# Patient Record
Sex: Female | Born: 1975
Health system: Southern US, Community
[De-identification: ages and names within clinical notes are randomized; demographics above are authoritative.]

## PROBLEM LIST (undated history)

## (undated) DIAGNOSIS — E063 Autoimmune thyroiditis: Secondary | ICD-10-CM

## (undated) DIAGNOSIS — J302 Other seasonal allergic rhinitis: Secondary | ICD-10-CM

## (undated) DIAGNOSIS — F419 Anxiety disorder, unspecified: Secondary | ICD-10-CM

## (undated) DIAGNOSIS — I10 Essential (primary) hypertension: Secondary | ICD-10-CM

## (undated) DIAGNOSIS — K219 Gastro-esophageal reflux disease without esophagitis: Secondary | ICD-10-CM

## (undated) DIAGNOSIS — E079 Disorder of thyroid, unspecified: Secondary | ICD-10-CM

## (undated) DIAGNOSIS — Z975 Presence of (intrauterine) contraceptive device: Secondary | ICD-10-CM

## (undated) DIAGNOSIS — Z8489 Family history of other specified conditions: Secondary | ICD-10-CM

## (undated) HISTORY — DX: Autoimmune thyroiditis: E06.3

## (undated) HISTORY — PX: WISDOM TOOTH EXTRACTION: SHX21

## (undated) HISTORY — DX: Essential (primary) hypertension: I10

## (undated) HISTORY — DX: Other seasonal allergic rhinitis: J30.2

## (undated) HISTORY — DX: Presence of (intrauterine) contraceptive device: Z97.5

## (undated) HISTORY — DX: Anxiety disorder, unspecified: F41.9

---

## 2001-01-27 ENCOUNTER — Emergency Department (HOSPITAL_COMMUNITY): Admission: EM | Admit: 2001-01-27 | Discharge: 2001-01-28 | Payer: Self-pay | Admitting: *Deleted

## 2001-01-27 ENCOUNTER — Encounter: Payer: Self-pay | Admitting: *Deleted

## 2001-07-18 ENCOUNTER — Emergency Department (HOSPITAL_COMMUNITY): Admission: EM | Admit: 2001-07-18 | Discharge: 2001-07-18 | Payer: Self-pay | Admitting: Internal Medicine

## 2001-07-18 ENCOUNTER — Encounter: Payer: Self-pay | Admitting: Internal Medicine

## 2006-05-12 ENCOUNTER — Ambulatory Visit: Payer: Self-pay | Admitting: Internal Medicine

## 2006-05-21 ENCOUNTER — Ambulatory Visit (HOSPITAL_COMMUNITY): Admission: RE | Admit: 2006-05-21 | Discharge: 2006-05-21 | Payer: Self-pay | Admitting: Internal Medicine

## 2006-11-04 ENCOUNTER — Ambulatory Visit: Payer: Self-pay | Admitting: Internal Medicine

## 2007-07-27 ENCOUNTER — Ambulatory Visit (HOSPITAL_COMMUNITY): Admission: RE | Admit: 2007-07-27 | Discharge: 2007-07-27 | Payer: Self-pay | Admitting: Obstetrics & Gynecology

## 2007-09-30 ENCOUNTER — Other Ambulatory Visit: Admission: RE | Admit: 2007-09-30 | Discharge: 2007-09-30 | Payer: Self-pay | Admitting: Obstetrics & Gynecology

## 2008-06-07 ENCOUNTER — Ambulatory Visit: Payer: Self-pay | Admitting: Family Medicine

## 2008-06-07 ENCOUNTER — Inpatient Hospital Stay (HOSPITAL_COMMUNITY): Admission: AD | Admit: 2008-06-07 | Discharge: 2008-06-10 | Payer: Self-pay | Admitting: Obstetrics & Gynecology

## 2010-06-25 ENCOUNTER — Other Ambulatory Visit: Admission: RE | Admit: 2010-06-25 | Discharge: 2010-06-25 | Payer: Self-pay | Admitting: Obstetrics and Gynecology

## 2010-12-29 ENCOUNTER — Ambulatory Visit (INDEPENDENT_AMBULATORY_CARE_PROVIDER_SITE_OTHER): Payer: 59 | Admitting: Internal Medicine

## 2010-12-29 DIAGNOSIS — K5900 Constipation, unspecified: Secondary | ICD-10-CM

## 2011-07-30 ENCOUNTER — Other Ambulatory Visit: Payer: Self-pay | Admitting: Obstetrics & Gynecology

## 2011-07-30 ENCOUNTER — Other Ambulatory Visit (HOSPITAL_COMMUNITY)
Admission: RE | Admit: 2011-07-30 | Discharge: 2011-07-30 | Disposition: A | Discharge status: Home / Self Care | Payer: 59 | Source: Ambulatory Visit | Attending: Obstetrics & Gynecology | Admitting: Obstetrics & Gynecology

## 2011-07-30 DIAGNOSIS — Z01419 Encounter for gynecological examination (general) (routine) without abnormal findings: Secondary | ICD-10-CM | POA: Insufficient documentation

## 2011-12-07 ENCOUNTER — Other Ambulatory Visit (INDEPENDENT_AMBULATORY_CARE_PROVIDER_SITE_OTHER): Payer: Self-pay | Admitting: *Deleted

## 2011-12-07 NOTE — Telephone Encounter (Signed)
Patient called [facility] and requested a refill of Amitiza 24 mcg #60. Take one capsule by mouth twice a day.

## 2011-12-08 ENCOUNTER — Other Ambulatory Visit (INDEPENDENT_AMBULATORY_CARE_PROVIDER_SITE_OTHER): Payer: Self-pay | Admitting: *Deleted

## 2011-12-08 MED ORDER — LUBIPROSTONE 24 MCG PO CAPS: 24.0000 ug | ORAL_CAPSULE | Freq: Two times a day (BID) | ORAL | Status: AC

## 2011-12-08 NOTE — Telephone Encounter (Signed)
Patient called [facility] to request a refill on her Amitiza 24 mcg. Take one capsule by mouth twice a day.

## 2011-12-08 NOTE — Telephone Encounter (Signed)
This has been addressed.

## 2012-01-24 ENCOUNTER — Encounter (HOSPITAL_COMMUNITY): Payer: Self-pay | Admitting: *Deleted

## 2012-01-24 ENCOUNTER — Emergency Department (HOSPITAL_COMMUNITY)
Admission: EM | Admit: 2012-01-24 | Discharge: 2012-01-24 | Disposition: A | Discharge status: Home / Self Care | Payer: 59 | Attending: Emergency Medicine | Admitting: Emergency Medicine

## 2012-01-24 DIAGNOSIS — M62838 Other muscle spasm: Secondary | ICD-10-CM | POA: Insufficient documentation

## 2012-01-24 DIAGNOSIS — M25519 Pain in unspecified shoulder: Secondary | ICD-10-CM | POA: Insufficient documentation

## 2012-01-24 DIAGNOSIS — M542 Cervicalgia: Secondary | ICD-10-CM | POA: Insufficient documentation

## 2012-01-24 HISTORY — DX: Disorder of thyroid, unspecified: E07.9

## 2012-01-24 MED ORDER — DIAZEPAM 5 MG PO TABS: 5.0000 mg | ORAL_TABLET | Freq: Two times a day (BID) | ORAL | Status: AC

## 2012-01-24 MED ORDER — OXYCODONE-ACETAMINOPHEN 5-325 MG PO TABS: 2.0000 | ORAL_TABLET | ORAL | Status: AC | PRN

## 2012-01-24 MED ORDER — DIAZEPAM 5 MG PO TABS
5.0000 mg | ORAL_TABLET | Freq: Once | ORAL | Status: AC
  Administered 2012-01-24: 5 mg via ORAL
  Filled 2012-01-24: qty 1

## 2012-01-24 MED ORDER — IBUPROFEN 800 MG PO TABS: 800.0000 mg | ORAL_TABLET | Freq: Three times a day (TID) | ORAL | Status: AC

## 2012-01-24 MED ORDER — IBUPROFEN 800 MG PO TABS
800.0000 mg | ORAL_TABLET | Freq: Once | ORAL | Status: AC
  Administered 2012-01-24: 800 mg via ORAL
  Filled 2012-01-24: qty 1

## 2012-01-24 NOTE — ED Notes (Signed)
Pt reports waking up on Saturday morning and feeling neck muscle pull.  States that pain increased through day and woke her tonight.  Reports feeling muscle spasms and pain on left side of neck and shoulder.

## 2012-01-24 NOTE — Discharge Instructions (Signed)
Torticollis, Acute  You have suddenly (acutely) developed a twisted neck (torticollis). This is usually a self-limited condition.  CAUSES  Acute torticollis may be caused by malposition, trauma or infection. Most commonly, acute torticollis is caused by sleeping in an awkward position. Torticollis may also be caused by the flexion, extension or twisting of the neck muscles beyond their normal position. Sometimes, the exact cause may not be known.  SYMPTOMS  Usually, there is pain and limited movement of the neck. Your neck may twist to one side.  DIAGNOSIS  The diagnosis is often made by physical examination. X-rays, CT scans or MRIs may be done if there is a history of trauma or concern of infection.  TREATMENT  For a common, stiff neck that develops during sleep, treatment is focused on relaxing the contracted neck muscle. Medications (including shots) may be used to treat the problem. Most cases resolve in several days. Torticollis usually responds to conservative physical therapy. If left untreated, the shortened and spastic neck muscle can cause deformities in the face and neck. Rarely, surgery is required.  HOME CARE INSTRUCTIONS  Use over-the-counter and prescription medications as directed by your caregiver.  Do stretching exercises and massage the neck as directed by your caregiver.  Follow up with physical therapy if needed and as directed by your caregiver.  SEEK IMMEDIATE MEDICAL CARE IF:  You develop difficulty breathing or noisy breathing (stridor).  You drool, develop trouble swallowing or have pain with swallowing.  You develop numbness or weakness in the hands or feet.  You have changes in speech or vision.  You have problems with urination or bowel movements.  You have difficulty walking.  You have a fever.  You have increased pain.  MAKE SURE YOU:  Understand these instructions.  Will watch your condition.  Will get help right away if you are not doing well or get worse.

## 2012-01-24 NOTE — ED Notes (Signed)
Gave patient ice pack to apply to left neck and shoulder. Patient sitting in chair at bedside with ice pack on left shoulder at this time. Family member at bedside also.

## 2012-01-25 NOTE — ED Provider Notes (Signed)
History     CSN: [id number]  Arrival date & time 01/24/12  0448   First MD Initiated Contact with Patient 01/24/12 [phone]      Chief Complaint  Patient presents with  . Neck Pain    (Consider location/radiation/quality/duration/timing/severity/associated sxs/prior treatment) HPI History provided by the patient. She woke up yesterday morning with left-sided neck pain. She felt like she may have slept wrong. Pain is mostly in the left trapezial region. She feels like she is having muscle spasms. Pain radiates to left shoulder. No associated weakness or numbness. She took some ibuprofen at home with some relief. Tonight she went to go to bed and had severe pain, unable to turn neck, and unable to sleep. No history of similar symptoms. No trauma or fall and denies any recalled history of heavy lifting or exertion. 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 Pain is severe.no known alleviating factors. Past Medical History  Diagnosis Date  . Thyroid disease     Past Surgical History  Procedure Date  . Wisdom tooth extraction     History reviewed. No pertinent family history.  History  Substance Use Topics  . Smoking status: Never Smoker   . Smokeless tobacco: Not on file  . Alcohol Use: No    OB History    Grav Para Term Preterm Abortions TAB SAB Ect Mult Living                  Review of Systems  Constitutional: Negative for fever and chills.  HENT: Positive for neck pain and neck stiffness.   Eyes: Negative for pain.  Respiratory: Negative for shortness of breath.   Cardiovascular: Negative for chest pain.  Gastrointestinal: Negative for abdominal pain.  Genitourinary: Negative for dysuria.  Musculoskeletal: Negative for back pain.  Skin: Negative for rash.  Neurological: Negative for headaches.  All other systems reviewed and are negative.    Allergies  Penicillins and Prednisone  Home Medications   Current Outpatient  Rx  Name Route Sig Dispense Refill  . CITALOPRAM HYDROBROMIDE 20 MG PO TABS Oral Take 20 mg by mouth daily.    Marland Kitchen LEVOTHYROXINE SODIUM 25 MCG PO TABS Oral Take 25 mcg by mouth daily.    Marland Kitchen DIAZEPAM 5 MG PO TABS Oral Take 1 tablet (5 mg total) by mouth 2 (two) times daily. 10 tablet 0  . IBUPROFEN 800 MG PO TABS Oral Take 1 tablet (800 mg total) by mouth 3 (three) times daily. 21 tablet 0  . OXYCODONE-ACETAMINOPHEN 5-325 MG PO TABS Oral Take 2 tablets by mouth every 4 (four) hours as needed for pain. 6 tablet 0    BP 122/80  Pulse 70  Temp(Src) 98.1 F (36.7 C) (Oral)  Resp 20  Ht 5\' 6"  (1.676 m)  Wt 150 lb (68.04 kg)  BMI 24.21 kg/m2  SpO2 98%  Physical Exam  Constitutional: She is oriented to person, place, and time. She appears well-developed and well-nourished.  HENT:  Head: Normocephalic and atraumatic.  Eyes: Conjunctivae and EOM are normal. Pupils are equal, round, and reactive to light.  Neck: Trachea normal. No edema and no erythema present.       Left paracervical and trapezial muscle spasm and tenderness. No fluctuance erythema or increased warmth to touch. Shoulder is nontender without deformity. Reproducible symptoms with lateral rotation of her neck to the right. Limited range of motion of neck secondary to pain.  Cardiovascular: Normal rate, regular rhythm, S1  normal, S2 normal and normal pulses.     No systolic murmur is present   No diastolic murmur is present  Pulses:      Radial pulses are 2+ on the right side, and 2+ on the left side.  Pulmonary/Chest: Effort normal and breath sounds normal. She has no wheezes. She has no rhonchi. She has no rales. She exhibits no tenderness.  Musculoskeletal:       BLE:s Calves nontender, no cords or erythema, negative Homans sign  Neurological: She is alert and oriented to person, place, and time. She has normal strength. No cranial nerve deficit or sensory deficit. GCS eye subscore is 4. GCS verbal subscore is 5. GCS motor  subscore is 6.  Skin: Skin is warm and dry. No rash noted. She is not diaphoretic.  Psychiatric: Her speech is normal.       Cooperative and appropriate    ED Course  Procedures (including critical care time)  Labs Reviewed - No data to display No results found.   1. Neck muscle spasm    Valium, Motrin and pain medications provided.  Recheck after medications as pain much improved with improving range of motion. Patient states she is feeling much better. Exam otherwise unchanged with neurovascular intact   MDM   Neck pain. presentation consistent with torticollis/ muscle spasm. Improved with medications. Plan ice, rest, medications and followup as needed. Work note provided as needed for any persistent symptoms. Patient has primary care physician for any persistent symptoms.        Sunnie Nielsen, MD 01/25/12 [phone]

## 2012-02-09 ENCOUNTER — Encounter (INDEPENDENT_AMBULATORY_CARE_PROVIDER_SITE_OTHER): Payer: Self-pay | Admitting: Internal Medicine

## 2012-02-09 ENCOUNTER — Ambulatory Visit (INDEPENDENT_AMBULATORY_CARE_PROVIDER_SITE_OTHER): Payer: 59 | Admitting: Internal Medicine

## 2012-02-09 DIAGNOSIS — E039 Hypothyroidism, unspecified: Secondary | ICD-10-CM | POA: Insufficient documentation

## 2012-02-09 DIAGNOSIS — K5900 Constipation, unspecified: Secondary | ICD-10-CM | POA: Insufficient documentation

## 2012-02-09 NOTE — Progress Notes (Signed)
Subjective:     Patient ID: Joanna Mcgee, female   DOB: 28-Jan-1976, 36 y.o.   MRN: [id number]  HPISuzanne is here today for f/u of her constipation. She is presently taking Amitiza 24 mcg BID for constipation. She tells me she is going about once a week with Ducolax or Correctal. She is still taking the [place].  She tells me she never goes more than once a week. She has been taking a stool softner.  She has been constipation for at least 15 yrs. She has even tried Glycerin suppositories but they do not help. Appetite is good. No weight loss. No melena or bright red rectal bleeding   Review of Systems see hpi Current Outpatient Prescriptions  Medication Sig Dispense Refill  . citalopram (CELEXA) 20 MG tablet Take 20 mg by mouth daily.      Marland Kitchen levothyroxine (SYNTHROID, LEVOTHROID) 25 MCG tablet Take 25 mcg by mouth daily.      Marland Kitchen lubiprostone (AMITIZA) 24 MCG capsule Take 24 mcg by mouth 2 (two) times daily with a meal.       Current Outpatient Prescriptions on File Prior to Visit  Medication Sig Dispense Refill  . citalopram (CELEXA) 20 MG tablet Take 20 mg by mouth daily.      Marland Kitchen levothyroxine (SYNTHROID, LEVOTHROID) 25 MCG tablet Take 25 mcg by mouth daily.       Past Surgical History  Procedure Date  . Wisdom tooth extraction    Family Status  Relation Status Death Age  . Mother Alive     good health  . Father Deceased     CHF, COPD  . Sister Alive     good health  . Brother Alive     good health   History   Social History  . Marital Status: Married    Spouse Name: N/A    Number of Children: N/A  . Years of Education: N/A   Occupational History  . Not on file.   Social History Main Topics  . Smoking status: Never Smoker   . Smokeless tobacco: Not on file  . Alcohol Use: No  . Drug Use: No  . Sexually Active:    Other Topics Concern  . Not on file   Social History Narrative  . No narrative on file   Allergies  Allergen Reactions  . Penicillins   .  Prednisone         Objective:   Physical Exam Filed Vitals:   02/09/12 1626  Height: 5\' 6"  (1.676 m)  Weight: 153 lb 12.8 oz (69.763 kg)  Alert and oriented. Skin warm and dry. Oral mucosa is moist.   . Sclera anicteric, conjunctivae is pink. Thyroid not enlarged. No cervical lymphadenopathy. Lungs clear. Heart regular rate and rhythm.  Abdomen is soft. Bowel sounds are positive. No hepatomegaly. No abdominal masses felt. No tenderness.  No edema to lower extremities. Patient is alert and oriented.       Assessment:    Chronic constipation which really has not responded to therapy. (Amitiza, fiber0   Plan:   Fiber 4 gm daily. Linzess daily samples for 9 days. PR in 2 weeks.

## 2012-02-09 NOTE — Patient Instructions (Signed)
Linzess daily. PR in 2 weeks.

## 2012-02-23 ENCOUNTER — Telehealth (INDEPENDENT_AMBULATORY_CARE_PROVIDER_SITE_OTHER): Payer: Self-pay | Admitting: Internal Medicine

## 2012-02-23 NOTE — Telephone Encounter (Signed)
She says the Linzess is working. She is having a BM about every other day. Will give her samples. 9 boxes (4 pills each box).   After this will e-prescribe an Rx to [facility].

## 2012-04-22 ENCOUNTER — Telehealth (INDEPENDENT_AMBULATORY_CARE_PROVIDER_SITE_OTHER): Payer: Self-pay | Admitting: *Deleted

## 2012-04-22 NOTE — Telephone Encounter (Signed)
Patient sates she is out of Linzess samples and wants to know if we have any, her # [phone]

## 2012-04-22 NOTE — Telephone Encounter (Signed)
4 boxes to front desk

## 2012-05-10 ENCOUNTER — Telehealth (INDEPENDENT_AMBULATORY_CARE_PROVIDER_SITE_OTHER): Payer: Self-pay | Admitting: *Deleted

## 2012-05-10 NOTE — Telephone Encounter (Signed)
Patient calling about her medication Linzess.  You have told her to call you when she needs samples.  (note she has [employer] insurance--what is her coverage for RX for this)

## 2012-05-10 NOTE — Telephone Encounter (Signed)
Message left at home 

## 2012-05-11 NOTE — Telephone Encounter (Signed)
Samples of Linzess x 5 boxes given to patient.

## 2012-08-02 ENCOUNTER — Other Ambulatory Visit (HOSPITAL_COMMUNITY)
Admission: RE | Admit: 2012-08-02 | Discharge: 2012-08-02 | Disposition: A | Discharge status: Home / Self Care | Payer: 59 | Source: Ambulatory Visit | Attending: Obstetrics & Gynecology | Admitting: Obstetrics & Gynecology

## 2012-08-02 ENCOUNTER — Other Ambulatory Visit: Payer: Self-pay | Admitting: Obstetrics & Gynecology

## 2012-08-02 DIAGNOSIS — Z01419 Encounter for gynecological examination (general) (routine) without abnormal findings: Secondary | ICD-10-CM | POA: Insufficient documentation

## 2012-08-09 ENCOUNTER — Ambulatory Visit (INDEPENDENT_AMBULATORY_CARE_PROVIDER_SITE_OTHER): Payer: 59 | Admitting: Internal Medicine

## 2012-08-16 ENCOUNTER — Ambulatory Visit (INDEPENDENT_AMBULATORY_CARE_PROVIDER_SITE_OTHER): Payer: 59 | Admitting: Internal Medicine

## 2012-08-16 ENCOUNTER — Encounter (INDEPENDENT_AMBULATORY_CARE_PROVIDER_SITE_OTHER): Payer: Self-pay | Admitting: Internal Medicine

## 2012-08-16 VITALS — BP 100/68 | HR 72 | Temp 98.2°F | Ht 67.0 in | Wt 154.9 lb

## 2012-08-16 DIAGNOSIS — K5900 Constipation, unspecified: Secondary | ICD-10-CM

## 2012-08-16 NOTE — Progress Notes (Signed)
Subjective:     Patient ID: Joanna Mcgee, female   DOB: May 31, 1976, 36 y.o.   MRN: [id number]  HPIHere today for f/u of her constipation. She tells me she is doing well.  She says she will get constipated around her menstrual cycle. She has a BM about every day or every other day. Appetite is good. No weight loss.  Has tried Amitiza BID without any relief. She was having one BM a week. She has a hx of constipation dating back for at least 15 yrs. She had even tried Glycerin suppositories, but these did not provide any relief.    HPISuzanne is here today for f/u of her constipation. She is presently taking Amitiza 24 mcg BID for constipation.  She tells me she is going about once a week with Ducolax or Correctal. She is still taking the [place].  She tells me she never goes more than once a week. She has been taking a stool softner.  She has been constipation for at least 15 yrs.  She has even tried Glycerin suppositories but they do not help.  Appetite is good. No weight loss. No melena or bright red rectal bleeding   Review of Systems see hpi Current Outpatient Prescriptions  Medication Sig Dispense Refill  . citalopram (CELEXA) 20 MG tablet Take 20 mg by mouth daily.      Marland Kitchen levothyroxine (SYNTHROID, LEVOTHROID) 25 MCG tablet Take 50 mcg by mouth daily.       Marland Kitchen Linaclotide (LINZESS) 290 MCG CAPS Take by mouth.      . lubiprostone (AMITIZA) 24 MCG capsule Take 24 mcg by mouth 2 (two) times daily with a meal.       Past Medical History  Diagnosis Date  . Thyroid disease    History   Social History  . Marital Status: Married    Spouse Name: N/A    Number of Children: N/A  . Years of Education: N/A   Occupational History  . Not on file.   Social History Main Topics  . Smoking status: Never Smoker   . Smokeless tobacco: Not on file  . Alcohol Use: No  . Drug Use: No  . Sexually Active:    Other Topics Concern  . Not on file   Social History Narrative  . No narrative on  file   Family Status  Relation Status Death Age  . Mother Alive     good health  . Father Deceased     CHF, COPD  . Sister Alive     good health  . Brother Alive     good health   Allergies  Allergen Reactions  . Penicillins   . Prednisone     Rash from head to toe        Objective:   Physical Exam Filed Vitals:   08/16/12 1552  BP: 100/68  Pulse: 72  Temp: 98.2 F (36.8 C)  Height: 5\' 7"  (1.702 m)  Weight: 154 lb 14.4 oz (70.262 kg)   Alert and oriented. Skin warm and dry. Oral mucosa is moist.   . Sclera anicteric, conjunctivae is pink. Thyroid not enlarged. No cervical lymphadenopathy. Lungs clear. Heart regular rate and rhythm.  Abdomen is soft. Bowel sounds are positive. No hepatomegaly. No abdominal masses felt. No tenderness.  No edema to lower extremities.        Assessment:    Constipation. Much improvement since starting Linzess. She has a BM every day or every other day. No  alarm symptoms.    Plan:     F/u in one year. Samples of linzess given to patient x boxes.

## 2012-08-16 NOTE — Patient Instructions (Addendum)
Continue LInzess . OV in 1 year.

## 2013-02-22 ENCOUNTER — Encounter: Payer: Self-pay | Admitting: *Deleted

## 2013-02-23 ENCOUNTER — Ambulatory Visit (INDEPENDENT_AMBULATORY_CARE_PROVIDER_SITE_OTHER): Payer: 59 | Admitting: Adult Health

## 2013-02-23 ENCOUNTER — Encounter: Payer: Self-pay | Admitting: Adult Health

## 2013-02-23 VITALS — BP 132/80 | Ht 66.0 in | Wt 153.0 lb

## 2013-02-23 DIAGNOSIS — Z30432 Encounter for removal of intrauterine contraceptive device: Secondary | ICD-10-CM

## 2013-02-23 NOTE — Progress Notes (Signed)
Subjective:     Patient ID: Joanna Mcgee, female   DOB: 12-Oct-1976, 37 y.o.   MRN: [id number]  HPI Joanna Mcgee is 37 year old white female in for IUD removal at the request of her PCP, she is having problems with her thyroid and ANA, her T3 is low and ANA is high.She is going to see Dr. Talmage Nap in near future.  Review of Systems Positives as in HPI  Reviewed past medical,surgical, social and family history. Reviewed medications and allergies.     Objective:   Physical Exam Blood pressure 132/80, height 5\' 6"  (1.676 m), weight 153 lb (69.4 kg).   IUD was easily removed with forceps. Assessment:      IUD removal    Plan:      Use condoms

## 2013-02-23 NOTE — Patient Instructions (Addendum)
Use condoms Sign up for my chart

## 2013-02-27 ENCOUNTER — Other Ambulatory Visit (INDEPENDENT_AMBULATORY_CARE_PROVIDER_SITE_OTHER): Payer: Self-pay | Admitting: Internal Medicine

## 2013-07-13 ENCOUNTER — Ambulatory Visit: Payer: 59 | Admitting: Advanced Practice Midwife

## 2013-07-19 ENCOUNTER — Ambulatory Visit (INDEPENDENT_AMBULATORY_CARE_PROVIDER_SITE_OTHER): Payer: 59 | Admitting: Obstetrics and Gynecology

## 2013-07-19 ENCOUNTER — Encounter: Payer: Self-pay | Admitting: Obstetrics and Gynecology

## 2013-07-19 VITALS — BP 122/80 | Ht 66.0 in | Wt 148.0 lb

## 2013-07-19 DIAGNOSIS — Z3200 Encounter for pregnancy test, result unknown: Secondary | ICD-10-CM

## 2013-07-19 DIAGNOSIS — E063 Autoimmune thyroiditis: Secondary | ICD-10-CM | POA: Insufficient documentation

## 2013-07-19 DIAGNOSIS — Z3202 Encounter for pregnancy test, result negative: Secondary | ICD-10-CM

## 2013-07-19 DIAGNOSIS — Z309 Encounter for contraceptive management, unspecified: Secondary | ICD-10-CM

## 2013-07-19 DIAGNOSIS — Z30430 Encounter for insertion of intrauterine contraceptive device: Secondary | ICD-10-CM

## 2013-07-19 LAB — POCT URINE PREGNANCY: Preg Test, Ur: NEGATIVE

## 2013-07-19 NOTE — Patient Instructions (Signed)
Intrauterine Device Insertion Most often, an intrauterine device (IUD) is inserted into the uterus to prevent pregnancy. There are 2 types of IUDs available:  Copper IUD. This type of IUD creates an environment that is not favorable to sperm survival. The mechanism of action of the copper IUD is not known for certain. It can stay in place for 10 years.  Hormone IUD. This type of IUD contains the hormone progestin (synthetic progesterone). The progestin thickens the cervical mucus and prevents sperm from entering the uterus, and it also thins the uterine lining. There is no evidence that the hormone IUD prevents implantation. The hormone IUD can stay in place for up to 5 years. An IUD is the most cost-effective birth control if left in place for the full duration. It may be removed at any time. LET YOUR CAREGIVER KNOW ABOUT:  Sensitivity to metals.  Medicines taken including herbs, eyedrops, over-the-counter medicines, and creams.  Use of steroids (by mouth or creams).  Previous problems with anesthetics or numbing medicine.  Previous gynecological surgery.  History of blood clots or clotting disorders.  Possibility of pregnancy.  Menstrual irregularities.  Concerns regarding unusual vaginal discharge or odors.  Previous experience with an IUD.  Other health problems. RISKS AND COMPLICATIONS  Accidental puncture (perforation) of the uterus.  Accidental placement of the IUD either in the muscle layer of the uterus (myometrium) or outside the uterus. If this happen, the IUD can be found essentially floating around the bowels. When this happens, the IUD must be taken out surgically.  The IUD may fall out of the uterus (expulsion). This is more common in women who have recently had a child.   Pregnancy in the fallopian tube (ectopic). BEFORE THE PROCEDURE  Schedule the IUD insertion for when you will have your menstrual period or right after, to make sure you are not pregnant.  Placement of the IUD is better tolerated shortly after a menstrual cycle.  You may need to take tests or be examined to make sure you are not pregnant.  You may be required to take a pregnancy test.  You may be required to get checked for sexually transmitted infections (STIs) prior to placement. Placing an IUD in someone who has an infection can make an infection worse.  You may be given a pain reliever to take 1 or 2 hours before the procedure.  An exam will be performed to determine the size and position of your uterus.  Ask your caregiver about changing or stopping your regular medicines. PROCEDURE   A tool (speculum) is placed in the vagina. This allows your caregiver to see the lower part of the uterus (cervix).  The cervix is prepped with a medicine that lowers the risk of infection.  You may be given a medicine to numb each side of the cervix (intracervical or paracervical block). This is used to block and control any discomfort with insertion.  A tool (uterine sound) is inserted into the uterus to determine the length of the uterine cavity and the direction the uterus may be tilted.  A slim instrument (IUD inserter) is inserted through the cervical canal and into your uterus.  The IUD is placed in the uterine cavity and the insertion device is removed.  The nylon string that is attached to the IUD, and used for eventual IUD removal, is trimmed. It is trimmed so that it lays high in the vagina, just outside the cervix. AFTER THE PROCEDURE  You may have bleeding after the   procedure. This is normal. It varies from light spotting for a few days to menstrual-like bleeding.  You may have mild cramping.  Practice checking the string coming out of the cervix to make sure the IUD remains in the uterus. If you cannot feel the string, you should schedule a "string check" with your caregiver.  If you had a hormone IUD inserted, expect that your period may be lighter or nonexistent  within a year's time (though this is not always the case). There may be delayed fertility with the hormone IUD as a result of its progesterone effect. When you are ready to become pregnant, it is suggested to have the IUD removed up to 1 year in advance.  Yearly exams are advised. Document Released: 05/27/2011 Document Revised: 12/21/2011 Document Reviewed: 05/27/2011 [employer] Patient Information 2014 [employer].  

## 2013-07-19 NOTE — Progress Notes (Signed)
Patient ID: Joanna Mcgee, female   DOB: 09-Oct-1976, 37 y.o.   MRN: [id number] Pt here for IUD insertion. LMP 06/19/13. Last sex couple weeks ago. UPT negative. GYNECOLOGY CLINIC PROCEDURE NOTE  Joanna Mcgee is a 37 y.o. G1P1001 here for Mirena IUD insertion. No GYN concerns.  Last pap smear was on 24 Jul 2012 and was normal.  IUD Insertion Procedure Note Patient identified, informed consent performed.  Discussed risks of irregular bleeding, cramping, infection, malpositioning or misplacement of the IUD outside the uterus which may require further procedures. Time out was performed.  Urine pregnancy test negative.  Speculum placed in the vagina.  Cervix visualized.  Cleaned with Betadine x 2.  Grasped anteriorly with a single tooth tenaculum.  Uterus sounded to 8 cm.  Mirena IUD placed per manufacturer's recommendations.  Strings trimmed to 2 cm. Tenaculum was removed, good hemostasis noted.  Patient tolerated procedure well.   Patient was given post-procedure instructions.  Patient was also asked to check IUD strings periodically and follow up in 1 weeks for IUD check and pap(16th).

## 2013-08-07 ENCOUNTER — Other Ambulatory Visit: Payer: 59 | Admitting: Adult Health

## 2013-08-24 ENCOUNTER — Ambulatory Visit (INDEPENDENT_AMBULATORY_CARE_PROVIDER_SITE_OTHER): Payer: 59 | Admitting: Obstetrics & Gynecology

## 2013-08-24 ENCOUNTER — Encounter: Payer: Self-pay | Admitting: Obstetrics & Gynecology

## 2013-08-24 ENCOUNTER — Other Ambulatory Visit (HOSPITAL_COMMUNITY)
Admission: RE | Admit: 2013-08-24 | Discharge: 2013-08-24 | Disposition: A | Discharge status: Home / Self Care | Payer: 59 | Source: Ambulatory Visit | Attending: Obstetrics & Gynecology | Admitting: Obstetrics & Gynecology

## 2013-08-24 ENCOUNTER — Encounter (INDEPENDENT_AMBULATORY_CARE_PROVIDER_SITE_OTHER): Payer: Self-pay

## 2013-08-24 VITALS — BP 100/80 | Ht 66.0 in | Wt 149.0 lb

## 2013-08-24 DIAGNOSIS — Z1151 Encounter for screening for human papillomavirus (HPV): Secondary | ICD-10-CM | POA: Insufficient documentation

## 2013-08-24 DIAGNOSIS — Z01419 Encounter for gynecological examination (general) (routine) without abnormal findings: Secondary | ICD-10-CM

## 2013-08-24 NOTE — Progress Notes (Signed)
Patient ID: Joanna Mcgee, female   DOB: 08-Jan-1976, 37 y.o.   MRN: [id number] Subjective:     Joanna Mcgee is a 37 y.o. female here for a routine exam.  No LMP recorded. Patient is not currently having periods (Reason: IUD). G1P1001 Current complaints: none, periods ar just episodic spotting.  Personal health questionnaire reviewed: no.   Gynecologic History No LMP recorded. Patient is not currently having periods (Reason: IUD). Contraception: IUD mirena Last Pap: 2013. Results were: normal Last mammogram: na. Results were: na  Past Medical History  Diagnosis Date  . Thyroid disease   . Hypertension   . Anxiety   . Seasonal allergies     Past Surgical History  Procedure Laterality Date  . Wisdom tooth extraction      OB History   Grav Para Term Preterm Abortions TAB SAB Ect Mult Living   1 1 1       1       History   Social History  . Marital Status: Married    Spouse Name: N/A    Number of Children: N/A  . Years of Education: N/A   Social History Main Topics  . Smoking status: Never Smoker   . Smokeless tobacco: Never Used  . Alcohol Use: No  . Drug Use: No  . Sexual Activity: Yes    Birth Control/ Protection: Condom   Other Topics Concern  . Not on file   Social History Narrative  . No narrative on file    Family History  Problem Relation Age of Onset  . Hypertension Mother   . Heart disease Mother     CAD  . Heart disease Father     CAD  . Hypertension Father      Review of Systems  Review of Systems  Constitutional: Negative for fever, chills, weight loss, malaise/fatigue and diaphoresis.  HENT: Negative for hearing loss, ear pain, nosebleeds, congestion, sore throat, neck pain, tinnitus and ear discharge.   Eyes: Negative for blurred vision, double vision, photophobia, pain, discharge and redness.  Respiratory: Negative for cough, hemoptysis, sputum production, shortness of breath, wheezing and stridor.   Cardiovascular: Negative for  chest pain, palpitations, orthopnea, claudication, leg swelling and PND.  Gastrointestinal: negative for abdominal pain. Negative for heartburn, nausea, vomiting, diarrhea, constipation, blood in stool and melena.  Genitourinary: Negative for dysuria, urgency, frequency, hematuria and flank pain.  Musculoskeletal: Negative for myalgias, back pain, joint pain and falls.  Skin: Negative for itching and rash.  Neurological: Negative for dizziness, tingling, tremors, sensory change, speech change, focal weakness, seizures, loss of consciousness, weakness and headaches.  Endo/Heme/Allergies: Negative for environmental allergies and polydipsia. Does not bruise/bleed easily.  Psychiatric/Behavioral: Negative for depression, suicidal ideas, hallucinations, memory loss and substance abuse. The patient is not nervous/anxious and does not have insomnia.        Objective:    Physical Exam  Vitals reviewed. Constitutional: She is oriented to person, place, and time. She appears well-developed and well-nourished.  HENT:  Head: Normocephalic and atraumatic.        Right Ear: External ear normal.  Left Ear: External ear normal.  Nose: Nose normal.  Mouth/Throat: Oropharynx is clear and moist.  Eyes: Conjunctivae and EOM are normal. Pupils are equal, round, and reactive to light. Right eye exhibits no discharge. Left eye exhibits no discharge. No scleral icterus.  Neck: Normal range of motion. Neck supple. No tracheal deviation present. No thyromegaly present.  Cardiovascular: Normal rate, regular rhythm, normal  heart sounds and intact distal pulses.  Exam reveals no gallop and no friction rub.   No murmur heard. Respiratory: Effort normal and breath sounds normal. No respiratory distress. She has no wheezes. She has no rales. She exhibits no tenderness.  GI: Soft. Bowel sounds are normal. She exhibits no distension and no mass. There is no tenderness. There is no rebound and no guarding.  Genitourinary:   Breasts no masses skin changes or nipple changes bilaterally      Vulva is normal without lesions Vagina is pink moist without discharge Cervix normal in appearance and pap is done, IUD strings visible Uterus is normal size shape and contour Adnexa is negative with normal sized ovaries  Rectal    hemoccult negative, normal tone, no masses  Musculoskeletal: Normal range of motion. She exhibits no edema and no tenderness.  Neurological: She is alert and oriented to person, place, and time. She has normal reflexes. She displays normal reflexes. No cranial nerve deficit. She exhibits normal muscle tone. Coordination normal.  Skin: Skin is warm and dry. No rash noted. No erythema. No pallor.  Psychiatric: She has a normal mood and affect. Her behavior is normal. Judgment and thought content normal.       Assessment:    Healthy female exam.    Plan:    Contraception: IUD.   Follow up 1 year

## 2013-09-06 ENCOUNTER — Encounter (INDEPENDENT_AMBULATORY_CARE_PROVIDER_SITE_OTHER): Payer: Self-pay | Admitting: *Deleted

## 2013-09-06 ENCOUNTER — Other Ambulatory Visit (INDEPENDENT_AMBULATORY_CARE_PROVIDER_SITE_OTHER): Payer: Self-pay | Admitting: Internal Medicine

## 2013-09-06 DIAGNOSIS — K5900 Constipation, unspecified: Secondary | ICD-10-CM

## 2014-01-01 ENCOUNTER — Encounter (INDEPENDENT_AMBULATORY_CARE_PROVIDER_SITE_OTHER): Payer: Self-pay | Admitting: Internal Medicine

## 2014-01-01 ENCOUNTER — Ambulatory Visit (INDEPENDENT_AMBULATORY_CARE_PROVIDER_SITE_OTHER): Payer: 59 | Admitting: Internal Medicine

## 2014-01-01 VITALS — BP 110/78 | HR 72 | Temp 97.9°F | Resp 18 | Ht 67.0 in | Wt 156.2 lb

## 2014-01-01 DIAGNOSIS — K5900 Constipation, unspecified: Secondary | ICD-10-CM

## 2014-01-01 MED ORDER — LINACLOTIDE 290 MCG PO CAPS
290.0000 ug | ORAL_CAPSULE | Freq: Every day | ORAL | Status: DC
Start: 2014-01-01 — End: 2014-03-21

## 2014-01-01 NOTE — Progress Notes (Signed)
Presenting complaint;  Follow for chronic constipation.  Subjective:  Patient is 38 year old Caucasian female with over a 15 year history of constipation who presents for scheduled visit. She was last seen in November 2013. She says Linz as is not working like it used to. She was diagnosed with Hashimoto's thyroiditis and now is on levothyroxine and wonders if thyroid condition his contribution to her bowel problems. She is on whole-grain diet Metamucil and eat fruits daily and bowels move normal than twice a week. On her best week she has 4 bowel movements. She recalls that her bowels are moving more regularly when IUD was removed in May 2014 to since then it has been reinserted. She also has found out that walking helps move her bowels. She walks about 5 times a week and generally 3 miles each time. She did not do better with addition of MiraLax and Colace. She is using Dulcolax sporadically and no more than once a month. She also complains of postprandial bloating. Her weight is stable.          Current Medications: Outpatient Encounter Prescriptions as of 01/01/2014  Medication Sig  . buPROPion (WELLBUTRIN XL) 150 MG 24 hr tablet Take 150 mg by mouth daily.  Marland Kitchen. levothyroxine (SYNTHROID, LEVOTHROID) 25 MCG tablet Take 75 mcg by mouth daily.   Marland Kitchen. LINZESS 290 MCG CAPS capsule TAKE ONE (1) CAPSULE EACH DAY  . liothyronine (CYTOMEL) 5 MCG tablet Take 5 mcg by mouth daily.  . Multiple Vitamins-Minerals (MULTI COMPLETE PO) Take by mouth daily.  . Omega-3 Fatty Acids (FISH OIL) 1000 MG CAPS Take by mouth. Patient states that she takes 2 by mouth daily.  . [DISCONTINUED] zonisamide (ZONEGRAN) 50 MG capsule Take 50 mg by mouth daily.     Objective: Blood pressure 110/78, pulse 72, temperature 97.9 F (36.6 C), temperature source Oral, resp. rate 18, height 5\' 7"  (1.702 m), weight 156 lb 3.2 oz (70.852 kg). Patient is alert and in no acute distress. Conjunctiva is pink. Sclera is  nonicteric Oropharyngeal mucosa is normal. No neck masses or thyromegaly noted. Cardiac exam with regular rhythm normal S1 and S2. No murmur or gallop noted. Lungs are clear to auscultation. Abdomen is symmetrical. Bowel sounds are normal. Abdomen is soft and nontender without organomegaly or masses. No LE edema or clubbing noted.  Labs/studies Results:  Patient had barium enema in August 2007 revealing very redundant sigmoid colon.  Assessment:  #1. Chronic constipation felt to be secondary to colonic dysmotility. She has tried all available remedies but none has worked well for her. Will try to find out if she could take higher dose of Linzess. If nothing works she would need colonic motility study and referral to tertiary center. I do not believe she has difficulty in disorder or pelvic floor dyssynergia.   Plan:  Patient will eat can of yogurt with probiotic daily. She will continue high fiber diet and eat one tablet every night. Stool diary for 6-8 weeks. Office visit in 6 months.

## 2014-01-01 NOTE — Patient Instructions (Signed)
Eat yogurt with  with probiotic daily. Continue high fiber diet as before and eat one apple every evening. Stool diary for 6-8 weeks and send us a summary

## 2014-01-22 ENCOUNTER — Telehealth (INDEPENDENT_AMBULATORY_CARE_PROVIDER_SITE_OTHER): Payer: Self-pay | Admitting: *Deleted

## 2014-01-22 NOTE — Telephone Encounter (Signed)
Samples were given to the patient per Dr.Rehman's approval. Lupita LeashDonna is calling the patient to make her aware.

## 2014-01-22 NOTE — Telephone Encounter (Signed)
Patient called and told she has samples ready for pick-up. [facility] understood and will be by on 01/23/14 to pick them up.

## 2014-01-22 NOTE — Telephone Encounter (Signed)
Joanna Mcgee lost her Linzess 290. She thinks it feel out of her pocket book. The pharmacist told her to see if we had samples because she could not get them refilled at this time. The return phone number is [phone].

## 2014-02-28 ENCOUNTER — Encounter (INDEPENDENT_AMBULATORY_CARE_PROVIDER_SITE_OTHER): Payer: Self-pay | Admitting: *Deleted

## 2014-02-28 ENCOUNTER — Encounter (HOSPITAL_COMMUNITY): Payer: Self-pay | Admitting: Pharmacy Technician

## 2014-02-28 ENCOUNTER — Other Ambulatory Visit (INDEPENDENT_AMBULATORY_CARE_PROVIDER_SITE_OTHER): Payer: Self-pay | Admitting: *Deleted

## 2014-02-28 DIAGNOSIS — K5900 Constipation, unspecified: Secondary | ICD-10-CM

## 2014-03-21 ENCOUNTER — Encounter (HOSPITAL_COMMUNITY)
Admission: RE | Disposition: A | Discharge status: Home / Self Care | Payer: Self-pay | Source: Ambulatory Visit | Attending: Internal Medicine

## 2014-03-21 ENCOUNTER — Encounter (HOSPITAL_COMMUNITY): Payer: Self-pay | Admitting: *Deleted

## 2014-03-21 ENCOUNTER — Ambulatory Visit (HOSPITAL_COMMUNITY)
Admission: RE | Admit: 2014-03-21 | Discharge: 2014-03-21 | Disposition: A | Discharge status: Home / Self Care | Payer: 59 | Source: Ambulatory Visit | Attending: Internal Medicine | Admitting: Internal Medicine

## 2014-03-21 DIAGNOSIS — F411 Generalized anxiety disorder: Secondary | ICD-10-CM | POA: Insufficient documentation

## 2014-03-21 DIAGNOSIS — K6289 Other specified diseases of anus and rectum: Secondary | ICD-10-CM

## 2014-03-21 DIAGNOSIS — K5900 Constipation, unspecified: Secondary | ICD-10-CM

## 2014-03-21 DIAGNOSIS — I10 Essential (primary) hypertension: Secondary | ICD-10-CM | POA: Insufficient documentation

## 2014-03-21 DIAGNOSIS — Z79899 Other long term (current) drug therapy: Secondary | ICD-10-CM | POA: Insufficient documentation

## 2014-03-21 DIAGNOSIS — K6389 Other specified diseases of intestine: Secondary | ICD-10-CM

## 2014-03-21 HISTORY — DX: Family history of other specified conditions: Z84.89

## 2014-03-21 HISTORY — PX: FLEXIBLE SIGMOIDOSCOPY: SHX5431

## 2014-03-21 HISTORY — DX: Gastro-esophageal reflux disease without esophagitis: K21.9

## 2014-03-21 SURGERY — SIGMOIDOSCOPY, FLEXIBLE
Anesthesia: Moderate Sedation

## 2014-03-21 MED ORDER — MEPERIDINE HCL 50 MG/ML IJ SOLN
INTRAMUSCULAR | Status: AC
  Filled 2014-03-21: qty 1

## 2014-03-21 MED ORDER — MIDAZOLAM HCL 5 MG/5ML IJ SOLN
INTRAMUSCULAR | Status: DC | PRN
  Administered 2014-03-21 (×3): 2 mg via INTRAVENOUS

## 2014-03-21 MED ORDER — MEPERIDINE HCL 25 MG/ML IJ SOLN
INTRAMUSCULAR | Status: DC | PRN
  Administered 2014-03-21 (×2): 25 mg via INTRAVENOUS

## 2014-03-21 MED ORDER — SODIUM CHLORIDE 0.9 % IV SOLN
INTRAVENOUS | Status: DC
  Administered 2014-03-21: 1000 mL via INTRAVENOUS

## 2014-03-21 MED ORDER — STERILE WATER FOR IRRIGATION IR SOLN
Status: DC | PRN
Start: 2014-03-21 — End: 2014-03-21
  Administered 2014-03-21: 13:00:00

## 2014-03-21 MED ORDER — MIDAZOLAM HCL 5 MG/5ML IJ SOLN
INTRAMUSCULAR | Status: AC
  Filled 2014-03-21: qty 10

## 2014-03-21 NOTE — Op Note (Addendum)
FLEXIBLE SIGMOIDOSCOPY  PROCEDURE REPORT  PATIENT:  Joanna Mcgee  MR#:  [id number] Birthdate:  07-Dec-1975, 38 y.o., female Endoscopist:  Dr. Malissa Hippo, MD Referred By:  Dr. Colette Ribas, MD  Procedure Date: 03/21/2014  Procedure:   Flexible sigmoidoscopy.  Indications: Patient is 38 year old Caucasian female with chronic constipation unresponsive to therapy who is interested in surgery. He is undergoing diagnostic flexible sigmoidoscopy prior to colonic motility study. She had barium enema back in 2007.  Informed Consent:  The procedure and risks were reviewed with the patient and informed consent was obtained.  Medications:  Demerol 50 mg IV Versed 6 mg IV  Description of procedure:  After a digital rectal exam was performed, that colonoscope was advanced from the anus through the rectum and colon to the area of descending colon where formed stool was present.  As the scope was slowly and cautiously withdrawn. The mucosal surfaces were carefully surveyed utilizing scope tip to flexion to facilitate fold flattening as needed. The scope was pulled down into the rectum where a thorough exam including retroflexion was performed.  Findings:   Prep excellent to 60 cm from anal margin. Diffuse pigmentation involving mucosa of descending colon, sigmoid colon and rectum. Three small anal papillae but no evidence of hemorrhoids.    Therapeutic/Diagnostic Maneuvers Performed:  None  Complications:  None   Impression:  Examination performed to descending colon. Diffuse changes of melanosis coli. Three small anal papillae.  Recommendations:  Standard instructions given. Hold Linzess. Sitz marker study to be scheduled in one week.  Sheamus Hasting U  03/21/2014 1:27 PM  CC: Dr. Phillips Odor, Chancy Hurter, MD & Dr. Bonnetta Barry ref. provider found

## 2014-03-21 NOTE — H&P (Signed)
Joanna Mcgee is an 38 y.o. female.   Chief Complaint: Patient is here for flexible sigmoidoscopy. HPI: Patient is 38 year old Caucasian female who presents with over a 15 year history of constipation. She has been tried on various medications but none has provided relief. She has been using Dulcolax on when necessary basis. She did have barium enema in 2007. She is interested in surgical intervention. Bowels move twice a week. She denies abdominal pain or rectal bleeding.  Past Medical History  Diagnosis Date  . Thyroid disease   . Hypertension   . Anxiety   . Seasonal allergies   . Family history of anesthesia complication     Dad coded after surgery. Believed it was codeine.  Marland Kitchen GERD (gastroesophageal reflux disease)     Past Surgical History  Procedure Laterality Date  . Wisdom tooth extraction      Family History  Problem Relation Age of Onset  . Hypertension Mother   . Heart disease Mother     CAD  . Heart disease Father     CAD  . Hypertension Father    Social History:  reports that she has never smoked. She has never used smokeless tobacco. She reports that she drinks alcohol. She reports that she does not use illicit drugs.  Allergies:  Allergies  Allergen Reactions  . Prednisone     Rash from head to toe  . Penicillins Rash    Medications Prior to Admission  Medication Sig Dispense Refill  . buPROPion (WELLBUTRIN XL) 150 MG 24 hr tablet Take 150 mg by mouth daily.      Marland Kitchen ibuprofen (ADVIL,MOTRIN) 200 MG tablet Take 200 mg by mouth every 6 (six) hours as needed for headache or moderate pain.      Marland Kitchen levothyroxine (SYNTHROID, LEVOTHROID) 75 MCG tablet Take 75 mcg by mouth daily before breakfast.      . Linaclotide (LINZESS) 290 MCG CAPS capsule Take 1 capsule (290 mcg total) by mouth daily.  30 capsule  5  . liothyronine (CYTOMEL) 5 MCG tablet Take 5 mcg by mouth daily.      . Multiple Vitamins-Minerals (MULTI COMPLETE PO) Take 1 tablet by mouth daily.       .  Omega-3 Fatty Acids (FISH OIL) 1000 MG CAPS Take 2 capsules by mouth daily. Patient states that she takes 2 by mouth daily.        No results found for this or any previous visit (from the past 48 hour(s)). No results found.  ROS  Blood pressure 123/76, pulse 78, temperature 97.8 F (36.6 C), temperature source Oral, resp. rate 14, height 5\' 6"  (1.676 m), weight 148 lb (67.132 kg), SpO2 100.00%. Physical Exam  Constitutional: She appears well-developed and well-nourished.  HENT:  Mouth/Throat: Oropharynx is clear and moist.  Eyes: Conjunctivae are normal. No scleral icterus.  Neck: No thyromegaly present.  Cardiovascular: Normal rate, regular rhythm and normal heart sounds.   Respiratory: Effort normal and breath sounds normal.  GI: Soft. She exhibits no distension and no mass. There is no tenderness.  Musculoskeletal: She exhibits no edema.  Lymphadenopathy:    She has no cervical adenopathy.  Neurological: She is alert.  Skin: Skin is warm and dry.     Assessment/Plan Chronic constipation unresponsive to therapy. Diagnostic flexible sigmoidoscopy.  Gabino Hagin U 03/21/2014, 1:10 PM

## 2014-03-21 NOTE — Discharge Instructions (Signed)
Resume usual medications but do not take Linaclotide. High-fiber diet. No driving for 24 hours Colonic motility study to be scheduled in one week.

## 2014-03-23 ENCOUNTER — Encounter (HOSPITAL_COMMUNITY): Payer: Self-pay | Admitting: Internal Medicine

## 2014-03-27 ENCOUNTER — Telehealth (INDEPENDENT_AMBULATORY_CARE_PROVIDER_SITE_OTHER): Payer: Self-pay | Admitting: *Deleted

## 2014-03-27 NOTE — Telephone Encounter (Signed)
Per op note patient needs sitz marker study -- per Dr Tyron RussellBoles this test is no longer done, it's "obsolete"  -- is there another test we can do -- please advise

## 2014-04-02 ENCOUNTER — Other Ambulatory Visit (INDEPENDENT_AMBULATORY_CARE_PROVIDER_SITE_OTHER): Payer: Self-pay | Admitting: *Deleted

## 2014-04-02 ENCOUNTER — Encounter (INDEPENDENT_AMBULATORY_CARE_PROVIDER_SITE_OTHER): Payer: Self-pay | Admitting: *Deleted

## 2014-04-02 DIAGNOSIS — K5900 Constipation, unspecified: Secondary | ICD-10-CM

## 2014-04-02 NOTE — Telephone Encounter (Signed)
Smart pill study sch'd 04/23/14, patient aware

## 2014-04-02 NOTE — Telephone Encounter (Signed)
Will proceed with smart pill study to determine colonic motility.

## 2014-04-02 NOTE — Telephone Encounter (Signed)
Left message asking patient to call me to sch'd smart pill study

## 2014-04-09 ENCOUNTER — Encounter (HOSPITAL_COMMUNITY): Payer: Self-pay | Admitting: Pharmacy Technician

## 2014-04-23 ENCOUNTER — Ambulatory Visit (HOSPITAL_COMMUNITY)
Admission: RE | Admit: 2014-04-23 | Discharge: 2014-04-23 | Disposition: A | Discharge status: Home / Self Care | Payer: 59 | Source: Ambulatory Visit | Attending: Internal Medicine | Admitting: Internal Medicine

## 2014-04-23 ENCOUNTER — Encounter (HOSPITAL_COMMUNITY)
Admission: RE | Disposition: A | Discharge status: Home / Self Care | Payer: Self-pay | Source: Ambulatory Visit | Attending: Internal Medicine

## 2014-04-23 DIAGNOSIS — E079 Disorder of thyroid, unspecified: Secondary | ICD-10-CM | POA: Diagnosis not present

## 2014-04-23 DIAGNOSIS — Z79899 Other long term (current) drug therapy: Secondary | ICD-10-CM | POA: Diagnosis not present

## 2014-04-23 DIAGNOSIS — K5909 Other constipation: Secondary | ICD-10-CM | POA: Insufficient documentation

## 2014-04-23 DIAGNOSIS — I10 Essential (primary) hypertension: Secondary | ICD-10-CM | POA: Diagnosis not present

## 2014-04-23 DIAGNOSIS — F411 Generalized anxiety disorder: Secondary | ICD-10-CM | POA: Diagnosis not present

## 2014-04-23 SURGERY — CAPSULE ENDOSCOPY, USING SMARTPILL MOTILITY TESTING SYSTEM

## 2014-04-23 NOTE — H&P (Signed)
Joanna Mcgee is an 38 y.o. female.   Chief Complaint: Patient is here for Smart pill study. HPI: Patient is a 38 year old Caucasian female who is several year history of constipation and not having satisfactory results with previous therapies. She had barium enema in August 2007 sigmoid colon was redundant and tortuous but no other abnormalities were noted. She underwent flexible sigmoidoscopy on 03/21/2014 revealing changes of melanosis coli. She is interested in surgical intervention. She is therefore returning for Smart pill study in order to determine colon transit time.  Past Medical History  Diagnosis Date  . Thyroid disease   . Hypertension   . Anxiety   . Seasonal allergies   . Family history of anesthesia complication     Dad coded after surgery. Believed it was codeine.  Marland Kitchen. GERD (gastroesophageal reflux disease)     Past Surgical History  Procedure Laterality Date  . Wisdom tooth extraction    . Flexible sigmoidoscopy N/A 03/21/2014    Procedure: FLEXIBLE SIGMOIDOSCOPY;  Surgeon: Malissa HippoNajeeb U Josclyn Rosales, MD;  Location: [facility];  Service: Endoscopy;  Laterality: N/A;  100    Family History  Problem Relation Age of Onset  . Hypertension Mother   . Heart disease Mother     CAD  . Heart disease Father     CAD  . Hypertension Father    Social History:  reports that she has never smoked. She has never used smokeless tobacco. She reports that she drinks alcohol. She reports that she does not use illicit drugs.  Allergies:  Allergies  Allergen Reactions  . Prednisone     Rash from head to toe  . Penicillins Rash    Medications Prior to Admission  Medication Sig Dispense Refill  . Probiotic Product (PROBIOTIC DAILY PO) Take 1 capsule by mouth daily.      Marland Kitchen. buPROPion (WELLBUTRIN XL) 150 MG 24 hr tablet Take 150 mg by mouth daily.      Marland Kitchen. ibuprofen (ADVIL,MOTRIN) 200 MG tablet Take 200 mg by mouth every 6 (six) hours as needed for headache or moderate pain.      Marland Kitchen.  levothyroxine (SYNTHROID, LEVOTHROID) 75 MCG tablet Take 75 mcg by mouth daily before breakfast.      . liothyronine (CYTOMEL) 5 MCG tablet Take 5 mcg by mouth daily.      . Multiple Vitamins-Minerals (MULTI COMPLETE PO) Take 1 tablet by mouth daily.         No results found for this or any previous visit (from the past 48 hour(s)). No results found.  ROS  Height 5\' 6"  (1.676 m), weight 150 lb (68.04 kg). Physical Exam  Constitutional: She appears well-developed and well-nourished.     Assessment/Plan Chronic constipation refractory to therapy. Smart pill study in order to determine colon transit time. Joanna Mcgee U 04/23/2014, 12:47 PM

## 2014-05-03 DIAGNOSIS — K5901 Slow transit constipation: Secondary | ICD-10-CM

## 2014-05-03 DIAGNOSIS — K5900 Constipation, unspecified: Secondary | ICD-10-CM

## 2014-05-03 NOTE — Op Note (Signed)
  Procedure;  GI motility study with Smart Pill.  Date of procedure; 04/23/2014.  Indication; patient is 38 year old Caucasian female with lifelong problem with constipation unresponsive to therapy. She is undergoing Smart pill study primarily to assess colonic motility or dysmotility. Seizure and was reviewed with the patient and informed consent was obtained.  Findings;                                        Gastric emptying time(GET);                     2 h and 50 m > 4 h suggests delayed gastric emptying  Small bowel transit time;                            7 h and 6 m Normal range is 2.5 to 6 h  Colonic transit time;                                   41 h and 23 m > 59 h indicates delayed clonic transit.  Small/large bowel transit time;                  48 h and 29 m > 65 h is delayed SLBTT  Whole gut transit time;                               51 h and 20 m Normal <73 h  Impression; Focus of this study was colonic transit which is within normal limits. Therefore patient does not have colonic dysmotility. Gastric emptying is within normal limits. Slightly prolonged small bowel transit of no clinical significance.  Recommendations; Patient advised to continue with High fiber diet and Metamucil daily. Patient advised to use glycerin or Dulcolax suppository on an as-needed basis. Office visit in 3 months.

## 2014-05-24 ENCOUNTER — Telehealth (INDEPENDENT_AMBULATORY_CARE_PROVIDER_SITE_OTHER): Payer: Self-pay | Admitting: *Deleted

## 2014-05-24 NOTE — Telephone Encounter (Signed)
Patient states you wanted her to be scheduled for gallbladder US, I don't see this anywhere, please advise

## 2014-06-04 ENCOUNTER — Other Ambulatory Visit (INDEPENDENT_AMBULATORY_CARE_PROVIDER_SITE_OTHER): Payer: Self-pay | Admitting: Internal Medicine

## 2014-06-04 DIAGNOSIS — R140 Abdominal distension (gaseous): Secondary | ICD-10-CM

## 2014-06-04 NOTE — Telephone Encounter (Signed)
[place] sch'd 06/11/14 @ 900 (845), npo after midnight, patient aware

## 2014-06-04 NOTE — Telephone Encounter (Signed)
Please schedule upper abdominal ultrasound; Indication is post prandial bloating

## 2014-06-07 ENCOUNTER — Other Ambulatory Visit (HOSPITAL_COMMUNITY): Payer: Self-pay | Admitting: Internal Medicine

## 2014-06-07 DIAGNOSIS — M549 Dorsalgia, unspecified: Secondary | ICD-10-CM

## 2014-06-11 ENCOUNTER — Ambulatory Visit (HOSPITAL_COMMUNITY)
Admission: RE | Admit: 2014-06-11 | Discharge: 2014-06-11 | Disposition: A | Discharge status: Home / Self Care | Payer: 59 | Source: Ambulatory Visit | Attending: Internal Medicine | Admitting: Internal Medicine

## 2014-06-11 DIAGNOSIS — R142 Eructation: Principal | ICD-10-CM

## 2014-06-11 DIAGNOSIS — R143 Flatulence: Secondary | ICD-10-CM | POA: Diagnosis present

## 2014-06-11 DIAGNOSIS — R141 Gas pain: Secondary | ICD-10-CM | POA: Diagnosis not present

## 2014-06-11 DIAGNOSIS — R140 Abdominal distension (gaseous): Secondary | ICD-10-CM

## 2014-06-12 ENCOUNTER — Ambulatory Visit (HOSPITAL_COMMUNITY)
Admission: RE | Admit: 2014-06-12 | Discharge: 2014-06-12 | Disposition: A | Discharge status: Home / Self Care | Payer: 59 | Source: Ambulatory Visit | Attending: Internal Medicine | Admitting: Internal Medicine

## 2014-06-12 DIAGNOSIS — M51379 Other intervertebral disc degeneration, lumbosacral region without mention of lumbar back pain or lower extremity pain: Secondary | ICD-10-CM | POA: Insufficient documentation

## 2014-06-12 DIAGNOSIS — M5137 Other intervertebral disc degeneration, lumbosacral region: Secondary | ICD-10-CM | POA: Diagnosis not present

## 2014-06-12 DIAGNOSIS — M545 Low back pain, unspecified: Secondary | ICD-10-CM | POA: Insufficient documentation

## 2014-06-12 DIAGNOSIS — M549 Dorsalgia, unspecified: Secondary | ICD-10-CM

## 2014-07-09 ENCOUNTER — Ambulatory Visit (INDEPENDENT_AMBULATORY_CARE_PROVIDER_SITE_OTHER): Payer: 59 | Admitting: Internal Medicine

## 2014-07-11 ENCOUNTER — Encounter (INDEPENDENT_AMBULATORY_CARE_PROVIDER_SITE_OTHER): Payer: Self-pay | Admitting: *Deleted

## 2014-07-11 ENCOUNTER — Telehealth (INDEPENDENT_AMBULATORY_CARE_PROVIDER_SITE_OTHER): Payer: Self-pay | Admitting: *Deleted

## 2014-07-11 NOTE — Telephone Encounter (Signed)
Joanna ChessmanSuzanne NO SHOWED for her apt with Dr. Karilyn Cotaehman on 07/09/14. A NS letter has been mailed.

## 2014-07-16 ENCOUNTER — Encounter (INDEPENDENT_AMBULATORY_CARE_PROVIDER_SITE_OTHER): Payer: Self-pay | Admitting: Internal Medicine

## 2014-08-13 ENCOUNTER — Encounter (HOSPITAL_COMMUNITY): Payer: Self-pay | Admitting: Internal Medicine

## 2014-09-18 ENCOUNTER — Other Ambulatory Visit (INDEPENDENT_AMBULATORY_CARE_PROVIDER_SITE_OTHER): Payer: Self-pay | Admitting: Internal Medicine

## 2014-09-27 ENCOUNTER — Encounter (INDEPENDENT_AMBULATORY_CARE_PROVIDER_SITE_OTHER): Payer: Self-pay

## 2014-10-11 ENCOUNTER — Telehealth (INDEPENDENT_AMBULATORY_CARE_PROVIDER_SITE_OTHER): Payer: Self-pay | Admitting: Internal Medicine

## 2014-10-11 ENCOUNTER — Other Ambulatory Visit (INDEPENDENT_AMBULATORY_CARE_PROVIDER_SITE_OTHER): Payer: Self-pay | Admitting: Internal Medicine

## 2014-10-11 DIAGNOSIS — K5900 Constipation, unspecified: Secondary | ICD-10-CM

## 2014-10-11 MED ORDER — LINACLOTIDE 290 MCG PO CAPS: 290.0000 ug | ORAL_CAPSULE | Freq: Every day | ORAL | Status: DC

## 2014-10-11 NOTE — Telephone Encounter (Signed)
rx xent

## 2014-10-11 NOTE — Telephone Encounter (Signed)
error 

## 2015-01-10 ENCOUNTER — Other Ambulatory Visit: Payer: Self-pay | Admitting: Physician Assistant

## 2015-04-08 ENCOUNTER — Ambulatory Visit (INDEPENDENT_AMBULATORY_CARE_PROVIDER_SITE_OTHER): Payer: Commercial Managed Care - HMO | Admitting: Adult Health

## 2015-04-08 ENCOUNTER — Encounter: Payer: Self-pay | Admitting: Adult Health

## 2015-04-08 VITALS — BP 132/80 | HR 80 | Ht 65.25 in | Wt 152.5 lb

## 2015-04-08 DIAGNOSIS — Z01419 Encounter for gynecological examination (general) (routine) without abnormal findings: Secondary | ICD-10-CM

## 2015-04-08 DIAGNOSIS — Z975 Presence of (intrauterine) contraceptive device: Secondary | ICD-10-CM

## 2015-04-08 HISTORY — DX: Presence of (intrauterine) contraceptive device: Z97.5

## 2015-04-08 NOTE — Patient Instructions (Signed)
Pap and physical in 1 year Mammogram at 42   

## 2015-04-08 NOTE — Progress Notes (Signed)
Patient ID: Joanna Mcgee, female   DOB: 19-Apr-1976, 39 y.o.   MRN: [id number] History of Present Illness: Joanna Mcgee is a 39 year old white female, married in for a well woman gyn exam.She had a normal pap with negative HPV 08/24/13. PCP is Dr Phillips Odor.  Current Medications, Allergies, Past Medical History, Past Surgical History, Family History and Social History were reviewed in [name] record.     Review of Systems: Patient denies any headaches, hearing loss, fatigue, blurred vision, shortness of breath, chest pain, abdominal pain, problems with urination, or intercourse. No joint pain or mood swings.She has chronic constipation and sees Dr Karilyn Cota.    Physical Exam:BP 132/80 mmHg  Pulse 80  Ht 5' 5.25" (1.657 m)  Wt 152 lb 8 oz (69.174 kg)  BMI 25.19 kg/m2 General:  Well developed, well nourished, no acute distress Skin:  Warm and dry,tan Neck:  Midline trachea, normal thyroid, good ROM, no lymphadenopathy Lungs; Clear to auscultation bilaterally Breast:  No dominant palpable mass, retraction, or nipple discharge Cardiovascular: Regular rate and rhythm Abdomen:  Soft, non tender, no hepatosplenomegaly Pelvic:  External genitalia is normal in appearance, no lesions.  The vagina is normal in appearance. Urethra has no lesions or masses. The cervix is bulbous, everted at os, with +IUD string seen.  Uterus is felt to be normal size, shape, and contour.  No adnexal masses or tenderness noted.Bladder is non tender, no masses felt. Extremities/musculoskeletal:  No swelling or varicosities noted, no clubbing or cyanosis Psych:  No mood changes, alert and cooperative,seems happy   Impression:  Well woman gyn exam no pap IUD in place   Plan: Pap and physical in 1 year Mammogram at 42

## 2015-11-02 ENCOUNTER — Other Ambulatory Visit (INDEPENDENT_AMBULATORY_CARE_PROVIDER_SITE_OTHER): Payer: Self-pay | Admitting: Internal Medicine

## 2016-05-04 ENCOUNTER — Other Ambulatory Visit (INDEPENDENT_AMBULATORY_CARE_PROVIDER_SITE_OTHER): Payer: Self-pay | Admitting: Internal Medicine

## 2016-06-30 ENCOUNTER — Telehealth (INDEPENDENT_AMBULATORY_CARE_PROVIDER_SITE_OTHER): Payer: Self-pay | Admitting: Internal Medicine

## 2016-06-30 MED ORDER — LINACLOTIDE 290 MCG PO CAPS: ORAL_CAPSULE | ORAL | 6 refills | Status: DC

## 2016-06-30 NOTE — Telephone Encounter (Signed)
Rx for Linzess sent to [facility].

## 2016-10-21 DIAGNOSIS — J069 Acute upper respiratory infection, unspecified: Secondary | ICD-10-CM | POA: Diagnosis not present

## 2016-10-21 DIAGNOSIS — R070 Pain in throat: Secondary | ICD-10-CM | POA: Diagnosis not present

## 2016-10-21 DIAGNOSIS — J343 Hypertrophy of nasal turbinates: Secondary | ICD-10-CM | POA: Diagnosis not present

## 2016-10-21 DIAGNOSIS — H6692 Otitis media, unspecified, left ear: Secondary | ICD-10-CM | POA: Diagnosis not present

## 2016-10-21 DIAGNOSIS — J329 Chronic sinusitis, unspecified: Secondary | ICD-10-CM | POA: Diagnosis not present

## 2017-05-06 ENCOUNTER — Ambulatory Visit (INDEPENDENT_AMBULATORY_CARE_PROVIDER_SITE_OTHER): Payer: 59 | Admitting: Adult Health

## 2017-05-06 ENCOUNTER — Other Ambulatory Visit (HOSPITAL_COMMUNITY)
Admission: RE | Admit: 2017-05-06 | Discharge: 2017-05-06 | Disposition: A | Discharge status: Home / Self Care | Payer: 59 | Source: Ambulatory Visit | Attending: Adult Health | Admitting: Adult Health

## 2017-05-06 ENCOUNTER — Encounter: Payer: Self-pay | Admitting: Adult Health

## 2017-05-06 VITALS — BP 116/64 | HR 72 | Ht 65.25 in | Wt 147.0 lb

## 2017-05-06 DIAGNOSIS — Z01419 Encounter for gynecological examination (general) (routine) without abnormal findings: Secondary | ICD-10-CM

## 2017-05-06 DIAGNOSIS — Z975 Presence of (intrauterine) contraceptive device: Secondary | ICD-10-CM | POA: Diagnosis not present

## 2017-05-06 NOTE — Progress Notes (Signed)
Patient ID: Beverley FiedlerSuzanne O Grima, female   DOB: [birth year], 41 y.o.   MRN: [id number] History of Present Illness: Rosalita ChessmanSuzanne is a 41 year old white female, married, G1P1 in for a well woman gyn exam and pap. PCP is Dr Phillips OdorGolding and she sees Dr Horald PollenBalen for thyroid.   Current Medications, Allergies, Past Medical History, Past Surgical History, Family History and Social History were [facility].     Review of Systems:  Patient denies any headaches, hearing loss, fatigue, blurred vision, shortness of breath, chest pain, abdominal pain, problems with bowel movements, urination, or intercourse. No joint pain or mood swings.Hot flashes at night some, and appetite increase around when should have period, but has IUD and no periods, IUD placed 07/19/13.   Physical Exam:BP 116/64 (BP Location: Left Arm, Patient Position: Sitting, Cuff Size: Normal)   Pulse 72   Ht 5' 5.25" (1.657 m)   Wt 147 lb (66.7 kg)   BMI 24.27 kg/m  General:  Well developed, well nourished, no acute distress Skin:  Warm and dry Neck:  Midline trachea, normal thyroid, good ROM, no lymphadenopathy Lungs; Clear to auscultation bilaterally Breast:  No dominant palpable mass, retraction, or nipple discharge Cardiovascular: Regular rate and rhythm Abdomen:  Soft, non tender, no hepatosplenomegaly Pelvic:  External genitalia is normal in appearance, no lesions.  The vagina is normal in appearance. Urethra has no lesions or masses. The cervix is bulbous.+IUD strings, pap with HPV performed.  Uterus is felt to be normal size, shape, and contour.  No adnexal masses or tenderness noted.Bladder is non tender, no masses felt. Extremities/musculoskeletal:  No swelling or varicosities noted, no clubbing or cyanosis Psych:  No mood changes, alert and cooperative,seems happy PHQ 2 score 0.  Impression: 1. Encounter for gynecological examination with Papanicolaou smear of cervix   2. IUD (intrauterine device)  in place       Plan: Physical in 1 year, pap in 3 if normal  Labs with PCP and Dr Horald PollenBalen Mammogram at 42

## 2017-05-11 LAB — CYTOLOGY - PAP
Adequacy: ABSENT
Diagnosis: NEGATIVE
HPV: NOT DETECTED

## 2017-06-15 ENCOUNTER — Other Ambulatory Visit (INDEPENDENT_AMBULATORY_CARE_PROVIDER_SITE_OTHER): Payer: Self-pay | Admitting: Internal Medicine

## 2017-06-21 DIAGNOSIS — E039 Hypothyroidism, unspecified: Secondary | ICD-10-CM | POA: Diagnosis not present

## 2017-07-02 DIAGNOSIS — M542 Cervicalgia: Secondary | ICD-10-CM | POA: Diagnosis not present

## 2017-07-02 DIAGNOSIS — Z6824 Body mass index (BMI) 24.0-24.9, adult: Secondary | ICD-10-CM | POA: Diagnosis not present

## 2017-07-16 ENCOUNTER — Ambulatory Visit (HOSPITAL_COMMUNITY): Payer: 59 | Attending: Family Medicine | Admitting: Physical Therapy

## 2017-07-16 DIAGNOSIS — R293 Abnormal posture: Secondary | ICD-10-CM | POA: Insufficient documentation

## 2017-07-16 DIAGNOSIS — M542 Cervicalgia: Secondary | ICD-10-CM

## 2017-07-16 NOTE — Therapy (Signed)
[facility] [address] [place], [place], [address] Phone: [phone]   Fax:  [phone]  Physical Therapy Evaluation  Patient Details  Name: Joanna Mcgee MRN: [id number] Date of Birth: [birth year] Referring Provider: Linford Arnold   Encounter Date: 07/16/2017      PT End of Session - 07/16/17 1206    Visit Number 1   Number of Visits 8   Date for PT Re-Evaluation 08/15/17   Authorization Type UHC   Authorization - Visit Number 1   Authorization - Number of Visits 8   PT Start Time 1128   PT Stop Time 1203   PT Time Calculation (min) 35 min   Activity Tolerance Patient tolerated treatment well   Behavior During Therapy [facility] for tasks assessed/performed      Past Medical History:  Diagnosis Date  . Anxiety   . Family history of anesthesia complication    Dad coded after surgery. Believed it was codeine.  Marland Kitchen GERD (gastroesophageal reflux disease)   . Hashimoto's disease   . Hypertension   . IUD (intrauterine device) in place 04/08/2015  . Seasonal allergies   . Thyroid disease     Past Surgical History:  Procedure Laterality Date  . FLEXIBLE SIGMOIDOSCOPY N/A 03/21/2014   Procedure: FLEXIBLE SIGMOIDOSCOPY;  Surgeon: Malissa Hippo, MD;  [facility];  Service: Endoscopy;  Laterality: N/A;  100  . WISDOM TOOTH EXTRACTION      There were no vitals filed for this visit.       Subjective Assessment - 07/16/17 1123    Subjective [name] states that she has been having increased neck pain for a couple of months.  She states that when she goes to do her hair she has increased burning all thru her upper trap area bilaterally.  She also experiences this at the end of the day due to being on the computer all day long.     Patient Stated Goals To no longer have the burning sensation, to be able to sleep without taking advil and using an ice pack.  To be able to reach overhead without discomfort and carry items  without increased pain    Currently in Pain? Yes   Pain Score 1   worst pain 7/10    Pain Location Neck   Pain Orientation Lower   Pain Descriptors / Indicators Burning;Aching   Pain Onset More than a month ago   Pain Frequency Constant  intensity varies    Aggravating Factors  lifting, computer, fixing hair    Pain Relieving Factors medication, cervical retraction and general stretches.   Effect of Pain on Daily Activities increases             [facility] PT Assessment - 07/16/17 0001      Assessment   Medical Diagnosis cervical strain    Referring Provider Linford Arnold    Onset Date/Surgical Date 05/12/17   Prior Therapy none     Precautions   Precautions None     Restrictions   Weight Bearing Restrictions No     Balance Screen   Has the patient fallen in the past 6 months No   Has the patient had a decrease in activity level because of a fear of falling?  Yes   Is the patient reluctant to leave their home because of a fear of falling?  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   Leisure gym, gardening      Cognition   Overall Cognitive Status Within Functional Limits for tasks assessed     Observation/Other Assessments   Focus on Therapeutic Outcomes (FOTO)  57     ROM / Strength   AROM / PROM / Strength AROM;Strength     AROM   AROM Assessment Site Cervical   Cervical Flexion 52 reps no change of sx    Cervical Extension 35 reps increase sx    Cervical - Right Side Bend 28   Cervical - Left Side Bend 31   Cervical - Right Rotation 40   Cervical - Left Rotation 60     Strength   Strength Assessment Site Cervical   Cervical Extension 4+/5   Cervical - Right Side Bend 3/5   Cervical - Left Side Bend 3/5            Objective measurements completed on examination: See above findings.          [facility] Adult PT Treatment/Exercise - 07/16/17 0001      Exercises    Exercises Neck     Neck Exercises: Seated   Cervical Isometrics Right lateral flexion;Left lateral flexion;5 reps   Neck Retraction 5 reps   Other Seated Exercise scapular retraction x 5     Neck Exercises: Supine   Neck Retraction 5 reps     Manual Therapy   Manual Therapy Manual Traction   Manual therapy comments decrease symptoms   Manual Traction 10 second holds x 15                PT Education - 07/16/17 1203    Education provided Yes   Education Details The importance of proper posture and body mechanics    Person(s) Educated Patient   Methods Explanation          PT Short Term Goals - 07/16/17 1212      PT SHORT TERM GOAL #1   Title Pt  cervical pain to be no greater than a 4/10 to allow pt to sleep without taking OTC pain medication    Time 2   Period Weeks   Status New   Target Date 07/30/17     PT SHORT TERM GOAL #2   Title Pt cervical ROM to be WFL to allow pt to turn her head to easily see blind side while driving.    Time 2   Period Weeks   Status New     PT SHORT TERM GOAL #3   Title Pt cervical and scapular strength to increase to allow pt to have no increase symptoms while carrying groceries to the car.    Time 2   Period Weeks   Status New           PT Long Term Goals - 07/16/17 1214      PT LONG TERM GOAL #1   Title Pt cervical pain to be no greater than a 2/10 to allow pt to sleep without having to use ice or medication at night.    Time 4   Period Weeks   Status New   Target Date 08/13/17     PT LONG TERM GOAL #2   Title Pt cervical and scapular stabilization mm to be strong enough to allow pt to fix her hair without any increase symptoms.   Time 4   Period Weeks     PT LONG TERM GOAL #3  Title Pt to verbalize and demonstrate the importance of posture and body mechanics in cervical care.    Time 4   Period Weeks     PT LONG TERM GOAL #4   Title Pt to be back at the gym working out on the elliptical with no  increase symptoms                 Plan - 07/16/17 1206    Clinical Impression Statement [name] is a 41 yo female who has been experiencing increased cervical pain with radiation to her shoulders bilaterally for approximately two months.  She has tried medication and cervical retraction exercises but they do not seem to be helping therefore she sought medical advice who referred her to skilled out patient physical therapy.   Examination demonstrates decreased ROM, decreased strength, posture dysfunction and increased pain.  Ms. Rhines will benefit from skilled physical therapy to address the above issues and maximize her functional ability.     Clinical Presentation Stable   Clinical Decision Making Low   Rehab Potential Good   PT Frequency 2x / week   PT Duration 4 weeks   PT Treatment/Interventions ADLs/Self Care Home Management;Ultrasound;Traction;Moist Heat;Patient/family education;Manual techniques   PT Next Visit Plan Begin w-back; money, postrural t-band exercises may try heat with mechanical cervical traction.    Consulted and Agree with Plan of Care Patient      Patient will benefit from skilled therapeutic intervention in order to improve the following deficits and impairments:  Decreased activity tolerance, Decreased range of motion, Decreased strength, Postural dysfunction, Pain  Visit Diagnosis: Cervicalgia - Plan: PT plan of care cert/re-cert  Abnormal posture - Plan: PT plan of care cert/re-cert     Problem List Patient Active Problem List   Diagnosis Date Noted  . IUD (intrauterine device) in place 04/08/2015  . Hashimoto's thyroiditis 07/19/2013  . Contraception management 07/19/2013  . Constipation 02/09/2012  . Hypothyroid 02/09/2012    Virgina Organ, PT CLT [phone] 07/16/2017, 12:23 PM  [facility] [address] [place], [place], [address] Phone: [phone]   Fax:  [phone]  Name: JOYIA RIEHLE MRN: [id number] Date of Birth: 1976-03-21

## 2017-07-16 NOTE — Patient Instructions (Signed)
Flexibility: Neck Retraction    Pull head straight back, keeping eyes and jaw level. Repeat 10____ times per set. Do __1__ sets per session. Do __2__ sessions per day.  http://orth.exer.us/344   Copyright  VHI. All rights reserved.  Scapular Retraction (Standing)    With arms at sides, pinch shoulder blades down and together. Repeat 10____ times per set. Do _1___ sets per session. Do ___2_ sessions per day.  http://orth.exer.us/944   Copyright  VHI. All rights reserved.  Strengthening: Lateral Bend - Isometric (in Neutral)    Using light pressure from palm of your hand , press into right temple. Resist bending head sideways. Hold _3-5___ seconds. Repeat ___10_ times per set. Do _1___ sets per session. Do ___2_ sessions per day.  http://orth.exer.us/302   Copyright  VHI. All rights reserved.  AROM: Lateral Neck Flexion    Slowly tilt head toward one shoulder, then the other. Hold each position _3___ seconds. Repeat _5-10___ times per set. Do __1__ sets per session. Do _2___ sessions per day.  http://orth.exer.us/296   Copyright  VHI. All rights reserved.   Sitting:  Push down with your heels while raising the crown of your head as high as possible.

## 2017-07-20 ENCOUNTER — Encounter (HOSPITAL_COMMUNITY): Payer: 59 | Admitting: Physical Therapy

## 2017-07-20 ENCOUNTER — Ambulatory Visit (HOSPITAL_COMMUNITY): Payer: 59 | Admitting: Physical Therapy

## 2017-07-20 ENCOUNTER — Telehealth (HOSPITAL_COMMUNITY): Payer: Self-pay | Admitting: Family Medicine

## 2017-07-20 ENCOUNTER — Encounter (HOSPITAL_COMMUNITY): Payer: Self-pay | Admitting: Physical Therapy

## 2017-07-20 DIAGNOSIS — M542 Cervicalgia: Secondary | ICD-10-CM

## 2017-07-20 DIAGNOSIS — R293 Abnormal posture: Secondary | ICD-10-CM

## 2017-07-20 NOTE — Telephone Encounter (Signed)
07/20/17  needed to reschedule because she had a patient to see at the same time as her appt today.  We rescheduled for this afternoon at 5:30

## 2017-07-20 NOTE — Therapy (Signed)
[facility] [address] [place], [place], [address] Phone: [phone]   Fax:  [phone]  Physical Therapy Treatment  Patient Details  Name: Joanna Mcgee MRN: [id number] Date of Birth: 04-04-1976 Referring Provider: Linford Arnold   Encounter Date: 07/20/2017      PT End of Session - 07/20/17 1816    Visit Number 2   Number of Visits 8   Date for PT Re-Evaluation 08/15/17   Authorization Type UHC   Authorization - Visit Number 2   Authorization - Number of Visits 8   PT Start Time 1732   PT Stop Time 1810   PT Time Calculation (min) 38 min   Activity Tolerance Patient tolerated treatment well   Behavior During Therapy [facility] for tasks assessed/performed      Past Medical History:  Diagnosis Date  . Anxiety   . Family history of anesthesia complication    Dad coded after surgery. Believed it was codeine.  Marland Kitchen GERD (gastroesophageal reflux disease)   . Hashimoto's disease   . Hypertension   . IUD (intrauterine device) in place 04/08/2015  . Seasonal allergies   . Thyroid disease     Past Surgical History:  Procedure Laterality Date  . FLEXIBLE SIGMOIDOSCOPY N/A 03/21/2014   Procedure: FLEXIBLE SIGMOIDOSCOPY;  Surgeon: Malissa Hippo, MD;  [facility];  Service: Endoscopy;  Laterality: N/A;  100  . WISDOM TOOTH EXTRACTION      There were no vitals filed for this visit.      Subjective Assessment - 07/20/17 1738    Subjective patient arrives today reporting she is feeling well, she has been working on her HEP with a focus on some strength especially    Patient Stated Goals To no longer have the burning sensation, to be able to sleep without taking advil and using an ice pack.  To be able to reach overhead without discomfort and carry items without increased pain    Currently in Pain? No/denies                         [facility] Adult PT Treatment/Exercise - 07/20/17 0001      Neck  Exercises: Theraband   Scapula Retraction 15 reps;Red   Scapula Retraction Limitations 2 second holds    Shoulder Extension 15 reps;Red   Shoulder Extension Limitations 2 second holds      Neck Exercises: Prone   Other Prone Exercise prone W, T, I, Y 1x15 B      Manual Therapy   Manual Therapy Soft tissue mobilization   Manual therapy comments performed separately from all other skilled services    Soft tissue mobilization B upper traps and cervical extensors      Neck Exercises: Stretches   Upper Trapezius Stretch 3 reps;30 seconds   Levator Stretch 2 reps;30 seconds   Other Neck Stretches SCM stertch 2x30 seconds                 PT Education - 07/20/17 1816    Education provided Yes   Education Details review of initial eval/goals, HEP update    Person(s) Educated Patient   Methods Explanation;Handout   Comprehension Verbalized understanding          PT Short Term Goals - 07/16/17 1212      PT SHORT TERM GOAL #1   Title Pt  cervical pain to be no greater than a 4/10 to allow pt to  sleep without taking OTC pain medication    Time 2   Period Weeks   Status New   Target Date 07/30/17     PT SHORT TERM GOAL #2   Title Pt cervical ROM to be WFL to allow pt to turn her head to easily see blind side while driving.    Time 2   Period Weeks   Status New     PT SHORT TERM GOAL #3   Title Pt cervical and scapular strength to increase to allow pt to have no increase symptoms while carrying groceries to the car.    Time 2   Period Weeks   Status New           PT Long Term Goals - 07/16/17 1214      PT LONG TERM GOAL #1   Title Pt cervical pain to be no greater than a 2/10 to allow pt to sleep without having to use ice or medication at night.    Time 4   Period Weeks   Status New   Target Date 08/13/17     PT LONG TERM GOAL #2   Title Pt cervical and scapular stabilization mm to be strong enough to allow pt to fix her hair without any increase symptoms.    Time 4   Period Weeks     PT LONG TERM GOAL #3   Title Pt to verbalize and demonstrate the importance of posture and body mechanics in cervical care.    Time 4   Period Weeks     PT LONG TERM GOAL #4   Title Pt to be back at the gym working out on the elliptical with no increase symptoms                Plan - 07/20/17 1817    Clinical Impression Statement Reviewed initial eval/goals, then proceeded with functional postural training with prone Y, T, W, and Is; performed cervical stretches following this then proceeded to general postural training and therband postural exercise. Patient doing well overall moving forward.    Rehab Potential Good   PT Frequency 2x / week   PT Duration 4 weeks   PT Treatment/Interventions ADLs/Self Care Home Management;Ultrasound;Traction;Moist Heat;Patient/family education;Manual techniques   PT Next Visit Plan Begin w-back; money, postrural t-band exercises may try heat with mechanical cervical traction.    Consulted and Agree with Plan of Care Patient      Patient will benefit from skilled therapeutic intervention in order to improve the following deficits and impairments:  Decreased activity tolerance, Decreased range of motion, Decreased strength, Postural dysfunction, Pain  Visit Diagnosis: Cervicalgia  Abnormal posture     Problem List Patient Active Problem List   Diagnosis Date Noted  . IUD (intrauterine device) in place 04/08/2015  . Hashimoto's thyroiditis 07/19/2013  . Contraception management 07/19/2013  . Constipation 02/09/2012  . Hypothyroid 02/09/2012     Nedra Hai PT, DPT [phone]  [facility] [address] [place], [place], [address] Phone: [phone]   Fax:  [phone]  Name: Joanna Mcgee MRN: [id number] Date of Birth: 10-18-1975

## 2017-07-20 NOTE — Patient Instructions (Signed)
    UPPER TRAP STRETCH - HOLDING CHAIR  While sitting in a chair, hold the seat with one hand and bend your head towards the opposite side for a gentle stretch to the side of the neck.  Hold for at least 30 seconds.  Repeat 2-3 times each side, twice a day.    Levator Scap Stretch   Sitting upright, tilt your head downward and towards your armpit. Hold once you feel a gentle stretch. Perform on both sides.   Hold for 30 seconds, repeat 2-3 times each side, twice a day.    SCM Stretch  1. With head in neutral position, place both hands around the front of your throat  2. Drag down slack skin towards your collar bones  3. Slowly tilt head back and look up  4. You should feel a strong stretch at the front of your neck  5. Hold position for 30 seconds then return head to neutral position.  Repeat 2 times, twice a day.

## 2017-07-21 ENCOUNTER — Encounter (HOSPITAL_COMMUNITY): Payer: 59

## 2017-07-27 ENCOUNTER — Encounter (HOSPITAL_COMMUNITY): Payer: Self-pay

## 2017-07-27 ENCOUNTER — Ambulatory Visit (HOSPITAL_COMMUNITY): Payer: 59

## 2017-07-27 DIAGNOSIS — R293 Abnormal posture: Secondary | ICD-10-CM

## 2017-07-27 DIAGNOSIS — M542 Cervicalgia: Secondary | ICD-10-CM

## 2017-07-27 NOTE — Therapy (Signed)
[facility] [address] [place], [place], [address] Phone: [phone]   Fax:  [phone]  Physical Therapy Treatment  Patient Details  Name: Joanna Mcgee MRN: [id number] Date of Birth: 1975/11/03 Referring Provider: Linford Arnold   Encounter Date: 07/27/2017      PT End of Session - 07/27/17 1649    Visit Number 3   Number of Visits 8   Date for PT Re-Evaluation 08/15/17   Authorization Type UHC   Authorization - Visit Number 3   Authorization - Number of Visits 8   PT Start Time 1604   PT Stop Time 1655   PT Time Calculation (min) 51 min   Activity Tolerance Patient tolerated treatment well;No increased pain   Behavior During Therapy WFL for tasks assessed/performed      Past Medical History:  Diagnosis Date  . Anxiety   . Family history of anesthesia complication    Dad coded after surgery. Believed it was codeine.  Marland Kitchen GERD (gastroesophageal reflux disease)   . Hashimoto's disease   . Hypertension   . IUD (intrauterine device) in place 04/08/2015  . Seasonal allergies   . Thyroid disease     Past Surgical History:  Procedure Laterality Date  . FLEXIBLE SIGMOIDOSCOPY N/A 03/21/2014   Procedure: FLEXIBLE SIGMOIDOSCOPY;  Surgeon: Malissa Hippo, MD;  [facility];  Service: Endoscopy;  Laterality: N/A;  100  . WISDOM TOOTH EXTRACTION      There were no vitals filed for this visit.      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  Does continue to c/o burning posterior cervical and scapular region.   Patient Stated Goals To no longer have the burning sensation, to be able to sleep without taking advil and using an ice pack.  To be able to reach overhead without discomfort and carry items without increased pain    Currently in Pain? No/denies  stiffness 2/10,  intermittent burning associated with work and posture                         [facility] Adult PT Treatment/Exercise - 07/27/17 0001      Neck Exercises: Prone   W Back 15 reps   Rows Limitations 15   Other Prone Exercise prone W, T, I, Y 1x15 B      Modalities   Modalities Traction     Traction   Type of Traction Cervical   Min (lbs) static   Max (lbs) 15   Hold Time static   Rest Time static   Time total     Manual Therapy   Manual Therapy Soft tissue mobilization   Manual therapy comments performed separately from all other skilled services    Soft tissue mobilization B upper traps and cervical extensors                   PT Short Term Goals - 07/16/17 1212      PT SHORT TERM GOAL #1   Title Pt  cervical pain to be no greater than a 4/10 to allow pt to sleep without taking OTC pain medication    Time 2   Period Weeks   Status New   Target Date 07/30/17     PT SHORT TERM GOAL #2   Title  Pt cervical ROM to be WFL to allow pt to turn her head to easily see blind side while driving.    Time 2   Period Weeks   Status New     PT SHORT TERM GOAL #3   Title Pt cervical and scapular strength to increase to allow pt to have no increase symptoms while carrying groceries to the car.    Time 2   Period Weeks   Status New           PT Long Term Goals - 07/16/17 1214      PT LONG TERM GOAL #1   Title Pt cervical pain to be no greater than a 2/10 to allow pt to sleep without having to use ice or medication at night.    Time 4   Period Weeks   Status New   Target Date 08/13/17     PT LONG TERM GOAL #2   Title Pt cervical and scapular stabilization mm to be strong enough to allow pt to fix her hair without any increase symptoms.   Time 4   Period Weeks     PT LONG TERM GOAL #3   Title Pt to verbalize and demonstrate the importance of posture and body mechanics in cervical care.    Time 4   Period Weeks     PT LONG TERM GOAL #4    Title Pt to be back at the gym working out on the elliptical with no increase symptoms                Plan - 07/27/17 1650    Clinical Impression Statement Continued education of importance of proper posture and progressed strengthening this session.  Pt reports burning in upper back/scapular region and finds posture affects the sensations.  Pt educated on importance of posture to reduce symptoms and instructed to utilize lumbar roll for assistance.  Manual soft tissue mobilization technqiues complete to address tension in upper traps and periscapular region.  EOS with mechanical traction with MHP to address burning sensation.  No reports of increased pain through session.     Rehab Potential Good   PT Frequency 2x / week   PT Duration 4 weeks   PT Treatment/Interventions ADLs/Self Care Home Management;Ultrasound;Traction;Moist Heat;Patient/family education;Manual techniques   PT Next Visit Plan F/u with mechanics cervcial traction and burning sensations.  Continue postural strengthening and manual to address tight cervical musculature.        Patient will benefit from skilled therapeutic intervention in order to improve the following deficits and impairments:  Decreased activity tolerance, Decreased range of motion, Decreased strength, Postural dysfunction, Pain  Visit Diagnosis: Cervicalgia  Abnormal posture     Problem List Patient Active Problem List   Diagnosis Date Noted  . IUD (intrauterine device) in place 04/08/2015  . Hashimoto's thyroiditis 07/19/2013  . Contraception management 07/19/2013  . Constipation 02/09/2012  . Hypothyroid 02/09/2012   Becky Sax, LPTA; CBIS [phone]  Juel Burrow 07/27/2017, 5:13 PM  [facility] [address] [place], [place], [address] Phone: [phone]   Fax:  [phone]  Name: Joanna Mcgee MRN: [id number] Date of Birth: 10/24/1975

## 2017-07-30 ENCOUNTER — Ambulatory Visit (HOSPITAL_COMMUNITY): Payer: 59

## 2017-07-30 ENCOUNTER — Encounter (HOSPITAL_COMMUNITY): Payer: Self-pay

## 2017-07-30 DIAGNOSIS — M542 Cervicalgia: Secondary | ICD-10-CM | POA: Diagnosis not present

## 2017-07-30 DIAGNOSIS — R293 Abnormal posture: Secondary | ICD-10-CM

## 2017-07-30 NOTE — Therapy (Signed)
[facility] [address] [place], [place], [address] Phone: [phone]   Fax:  [phone]  Physical Therapy Treatment  Patient Details  Name: Joanna FiedlerSuzanne O Mcgee MRN: [id number] Date of Birth: [birth year] Referring Provider: Linford ArnoldMichael Depasquale   Encounter Date: 07/30/2017      PT End of Session - 07/30/17 1525    Visit Number 4   Number of Visits 8   Date for PT Re-Evaluation 08/15/17   Authorization Type UHC   Authorization - Visit Number 4   Authorization - Number of Visits 8   PT Start Time 1522   PT Stop Time 1602   PT Time Calculation (min) 40 min   Activity Tolerance Patient tolerated treatment well;No increased pain   Behavior During Therapy WFL for tasks assessed/performed      Past Medical History:  Diagnosis Date  . Anxiety   . Family history of anesthesia complication    Dad coded after surgery. Believed it was codeine.  Marland Kitchen. GERD (gastroesophageal reflux disease)   . Hashimoto's disease   . Hypertension   . IUD (intrauterine device) in place 04/08/2015  . Seasonal allergies   . Thyroid disease     Past Surgical History:  Procedure Laterality Date  . FLEXIBLE SIGMOIDOSCOPY N/A 03/21/2014   Procedure: FLEXIBLE SIGMOIDOSCOPY;  Surgeon: Malissa HippoNajeeb U Rehman, MD;  [facility];  Service: Endoscopy;  Laterality: N/A;  100  . WISDOM TOOTH EXTRACTION      There were no vitals filed for this visit.      Subjective Assessment - 07/30/17 1523    Subjective Pt stated she was a little sore following last session, mainly soreness/stiffness.  Reports burning with overhead activiites and while moping today.     Patient Stated Goals To no longer have the burning sensation, to be able to sleep without taking advil and using an ice pack.  To be able to reach overhead without discomfort and carry items without increased pain    Currently in Pain? Yes   Pain Score 4    Pain Location Neck   Pain Orientation Right;Left   Pain  Descriptors / Indicators Aching;Sore   Pain Type Acute pain   Pain Onset More than a month ago   Pain Frequency Constant   Aggravating Factors  lifting, computer, fixing hair   Pain Relieving Factors medication, cervical retraction and general stretches   Effect of Pain on Daily Activities increases                         [facility] Adult PT Treatment/Exercise - 07/30/17 0001      Neck Exercises: Theraband   Scapula Retraction 15 reps;Green   Scapula Retraction Limitations HEP   Shoulder Extension 15 reps;Green   Shoulder Extension Limitations HEP   Rows 15 reps;Green   Rows Limitations HEP     Neck Exercises: Prone   W Back 15 reps   Rows Limitations 15     Manual Therapy   Manual Therapy Soft tissue mobilization;Manual Traction   Manual therapy comments performed separately from all other skilled services    Soft tissue mobilization Cervical and periscapular musculature in supine then prone position   Manual Traction 2 sets x 2 min holds                  PT Short Term Goals - 07/16/17 1212      PT SHORT TERM GOAL #1   Title  Pt  cervical pain to be no greater than a 4/10 to allow pt to sleep without taking OTC pain medication    Time 2   Period Weeks   Status New   Target Date 07/30/17     PT SHORT TERM GOAL #2   Title Pt cervical ROM to be WFL to allow pt to turn her head to easily see blind side while driving.    Time 2   Period Weeks   Status New     PT SHORT TERM GOAL #3   Title Pt cervical and scapular strength to increase to allow pt to have no increase symptoms while carrying groceries to the car.    Time 2   Period Weeks   Status New           PT Long Term Goals - 07/16/17 1214      PT LONG TERM GOAL #1   Title Pt cervical pain to be no greater than a 2/10 to allow pt to sleep without having to use ice or medication at night.    Time 4   Period Weeks   Status New   Target Date 08/13/17     PT LONG TERM GOAL #2   Title  Pt cervical and scapular stabilization mm to be strong enough to allow pt to fix her hair without any increase symptoms.   Time 4   Period Weeks     PT LONG TERM GOAL #3   Title Pt to verbalize and demonstrate the importance of posture and body mechanics in cervical care.    Time 4   Period Weeks     PT LONG TERM GOAL #4   Title Pt to be back at the gym working out on the elliptical with no increase symptoms                Plan - 07/30/17 1630    Clinical Impression Statement Session focus on cervical mobility and pain control to reduce burning in upper back/scapular region.  Soft tissue mobilization to address tightness/spasms in upper trap and periscapular region.  Pt reports preference wiht manual vs. mechanical traction.  EOS reports pain free and vast improvements with cervical rotation.  Pt encouraged to begin theraband postural strengthening HEP.     Rehab Potential Good   PT Frequency 2x / week   PT Duration 4 weeks   PT Treatment/Interventions ADLs/Self Care Home Management;Ultrasound;Traction;Moist Heat;Patient/family education;Manual techniques   PT Next Visit Plan F/U with relief with manual and burning sensations.  Continue postural strengthening and manual to cervical muscualture.        Patient will benefit from skilled therapeutic intervention in order to improve the following deficits and impairments:  Decreased activity tolerance, Decreased range of motion, Decreased strength, Postural dysfunction, Pain  Visit Diagnosis: Cervicalgia  Abnormal posture     Problem List Patient Active Problem List   Diagnosis Date Noted  . IUD (intrauterine device) in place 04/08/2015  . Hashimoto's thyroiditis 07/19/2013  . Contraception management 07/19/2013  . Constipation 02/09/2012  . Hypothyroid 02/09/2012   Becky Sax, LPTA; CBIS [phone]  Juel Burrow 07/30/2017, 6:48 PM  [facility] [address] [place], [place], [address] Phone: [phone]   Fax:  [phone]  Name: Joanna Mcgee MRN: [id number] Date of Birth: 16-Apr-1976

## 2017-08-03 ENCOUNTER — Ambulatory Visit (HOSPITAL_COMMUNITY): Payer: 59

## 2017-08-03 ENCOUNTER — Encounter (HOSPITAL_COMMUNITY): Payer: Self-pay

## 2017-08-03 DIAGNOSIS — M542 Cervicalgia: Secondary | ICD-10-CM

## 2017-08-03 DIAGNOSIS — R293 Abnormal posture: Secondary | ICD-10-CM

## 2017-08-03 NOTE — Therapy (Signed)
[facility] [address] [place], [place], [address] Phone: [phone]   Fax:  [phone]  Physical Therapy Treatment  Patient Details  Name: Joanna FiedlerSuzanne O Gadway MRN: [id number] Date of Birth: [birth year] Referring Provider: Linford ArnoldMichael Depasquale   Encounter Date: 08/03/2017      PT End of Session - 08/03/17 1519    Visit Number 5   Number of Visits 8   Date for PT Re-Evaluation 08/15/17   Authorization Type UHC   Authorization - Visit Number 5   Authorization - Number of Visits 8   PT Start Time 1510   PT Stop Time 1554   PT Time Calculation (min) 44 min   Activity Tolerance Patient tolerated treatment well;No increased pain   Behavior During Therapy WFL for tasks assessed/performed      Past Medical History:  Diagnosis Date  . Anxiety   . Family history of anesthesia complication    Dad coded after surgery. Believed it was codeine.  Marland Kitchen. GERD (gastroesophageal reflux disease)   . Hashimoto's disease   . Hypertension   . IUD (intrauterine device) in place 04/08/2015  . Seasonal allergies   . Thyroid disease     Past Surgical History:  Procedure Laterality Date  . FLEXIBLE SIGMOIDOSCOPY N/A 03/21/2014   Procedure: FLEXIBLE SIGMOIDOSCOPY;  Surgeon: Malissa HippoNajeeb U Rehman, MD;  [facility];  Service: Endoscopy;  Laterality: N/A;  100  . WISDOM TOOTH EXTRACTION      There were no vitals filed for this visit.      Subjective Assessment - 08/03/17 1517    Subjective Pt reports she is pain free and no reports of burning over weekend.  Has began the theraband postural strengthening wiht no questions or c/o.   Patient Stated Goals To no longer have the burning sensation, to be able to sleep without taking advil and using an ice pack.  To be able to reach overhead without discomfort and carry items without increased pain    Currently in Pain? No/denies                         [facility] Adult PT Treatment/Exercise -  08/03/17 0001      Neck Exercises: Standing   Wall Push Ups 10 reps   Upper Extremity Flexion with Stabilization 10 reps;Flexion   Other Standing Exercises shoulders up, back and down (relax) 10x     Neck Exercises: Prone   W Back 15 reps   Rows Limitations 15   UE Flexion with Stabilization Limitations 10   Other Prone Exercise prone W, T, I, Y 1x15 B      Manual Therapy   Manual Therapy Soft tissue mobilization   Manual therapy comments performed separately from all other skilled services    Soft tissue mobilization prone to upper traps and periscapular musculature   Manual Traction 2 sets x 2 min holds     Neck Exercises: Stretches   Corner Stretch 3 reps;30 seconds                  PT Short Term Goals - 07/16/17 1212      PT SHORT TERM GOAL #1   Title Pt  cervical pain to be no greater than a 4/10 to allow pt to sleep without taking OTC pain medication    Time 2   Period Weeks   Status New   Target Date 07/30/17     PT SHORT  TERM GOAL #2   Title Pt cervical ROM to be WFL to allow pt to turn her head to easily see blind side while driving.    Time 2   Period Weeks   Status New     PT SHORT TERM GOAL #3   Title Pt cervical and scapular strength to increase to allow pt to have no increase symptoms while carrying groceries to the car.    Time 2   Period Weeks   Status New           PT Long Term Goals - 07/16/17 1214      PT LONG TERM GOAL #1   Title Pt cervical pain to be no greater than a 2/10 to allow pt to sleep without having to use ice or medication at night.    Time 4   Period Weeks   Status New   Target Date 08/13/17     PT LONG TERM GOAL #2   Title Pt cervical and scapular stabilization mm to be strong enough to allow pt to fix her hair without any increase symptoms.   Time 4   Period Weeks     PT LONG TERM GOAL #3   Title Pt to verbalize and demonstrate the importance of posture and body mechanics in cervical care.    Time 4    Period Weeks     PT LONG TERM GOAL #4   Title Pt to be back at the gym working out on the elliptical with no increase symptoms                Plan - 08/03/17 1751    Clinical Impression Statement Pt progressing well with reports of no burning with UE overhead activities.  Session focus on cervical mobility with manual soft tissue mobilization and stretches.  Progressed postural strengthening with wall push-ups and UE flexion against wall, pt does c/o burning per fatigue, no reports of burning pain through session.  EOS with manual soft tissue mobilization to address moderate spasms with upper trap and periscapular region.  No reoprts of pain through session.     Rehab Potential Good   PT Frequency 2x / week   PT Duration 4 weeks   PT Treatment/Interventions ADLs/Self Care Home Management;Ultrasound;Traction;Moist Heat;Patient/family education;Manual techniques   PT Next Visit Plan Next session begin UE flexion with lift off wall and posterior UBE continue postural strengthening.  Manual to address cervical and periscapular tightness.        Patient will benefit from skilled therapeutic intervention in order to improve the following deficits and impairments:  Decreased activity tolerance, Decreased range of motion, Decreased strength, Postural dysfunction, Pain  Visit Diagnosis: Cervicalgia  Abnormal posture     Problem List Patient Active Problem List   Diagnosis Date Noted  . IUD (intrauterine device) in place 04/08/2015  . Hashimoto's thyroiditis 07/19/2013  . Contraception management 07/19/2013  . Constipation 02/09/2012  . Hypothyroid 02/09/2012   Becky Sax, LPTA; CBIS [phone]  Juel Burrow 08/03/2017, 6:00 PM   [facility] [address] [place], [place], [address] Phone: [phone]   Fax:  [phone]  Name: SHAYANNA THATCH MRN: [id number] Date of Birth: [birth year]

## 2017-08-06 ENCOUNTER — Ambulatory Visit (HOSPITAL_COMMUNITY): Payer: 59 | Admitting: Physical Therapy

## 2017-08-06 DIAGNOSIS — R293 Abnormal posture: Secondary | ICD-10-CM

## 2017-08-06 DIAGNOSIS — M542 Cervicalgia: Secondary | ICD-10-CM

## 2017-08-06 NOTE — Therapy (Signed)
[facility] [address] [place], [place], [address] Phone: [phone]   Fax:  [phone]  Physical Therapy Treatment  Patient Details  Name: Joanna Mcgee MRN: [id number] Date of Birth: [birth year] Referring Provider: Linford Arnold   Encounter Date: 08/06/2017      PT End of Session - 08/06/17 1606    Visit Number 6   Number of Visits 8   Date for PT Re-Evaluation 08/15/17   Authorization Type UHC   Authorization - Visit Number 6   Authorization - Number of Visits 8   PT Start Time 1515   PT Stop Time 1558   PT Time Calculation (min) 43 min   Activity Tolerance Patient tolerated treatment well;No increased pain   Behavior During Therapy WFL for tasks assessed/performed      Past Medical History:  Diagnosis Date  . Anxiety   . Family history of anesthesia complication    Dad coded after surgery. Believed it was codeine.  Marland Kitchen GERD (gastroesophageal reflux disease)   . Hashimoto's disease   . Hypertension   . IUD (intrauterine device) in place 04/08/2015  . Seasonal allergies   . Thyroid disease     Past Surgical History:  Procedure Laterality Date  . FLEXIBLE SIGMOIDOSCOPY N/A 03/21/2014   Procedure: FLEXIBLE SIGMOIDOSCOPY;  Surgeon: Malissa Hippo, MD;  [facility];  Service: Endoscopy;  Laterality: N/A;  100  . WISDOM TOOTH EXTRACTION      There were no vitals filed for this visit.      Subjective Assessment - 08/06/17 1516    Subjective Pt states that the range is better but she had a terrible headache yesterday.   She thinks it may have been the front moving in.   Patient Stated Goals To no longer have the burning sensation, to be able to sleep without taking advil and using an ice pack.  To be able to reach overhead without discomfort and carry items without increased pain    Currently in Pain? Yes   Pain Score 2    Pain Location Neck   Pain Descriptors / Indicators Aching   Pain Onset More  than a month ago                         [facility] Adult PT Treatment/Exercise - 08/06/17 0001      Neck Exercises: Standing   Wall Push Ups 10 reps   Upper Extremity Flexion with Stabilization 10 reps;Flexion     Neck Exercises: Seated   X to V 10 reps;Weight   X to V Weights (lbs) 1   W Back Weights (lbs) 2   Shoulder Rolls Cervical and thoracic excursions  Backwards;10 reps 3 reps      Neck Exercises: Sidelying   Lateral Flexion 10 reps     Neck Exercises: Prone   W Back 10 reps   Rows Limitations 10   Other Prone Exercise prone W, T, I, Y 1x15 B    Other Prone Exercise single arm raise x 10     Manual Therapy   Manual Therapy Soft tissue mobilization;Manual Traction;Joint mobilization   Manual therapy comments performed separately from all other skilled services    Joint Mobilization Grade II   Soft tissue mobilization prone and supine    Manual Traction 1' x 3 sets                   PT  Short Term Goals - 08/06/17 1608      PT SHORT TERM GOAL #1   Title Pt  cervical pain to be no greater than a 4/10 to allow pt to sleep without taking OTC pain medication    Time 2   Period Weeks   Status On-going     PT SHORT TERM GOAL #2   Title Pt cervical ROM to be WFL to allow pt to turn her head to easily see blind side while driving.    Time 2   Period Weeks   Status Achieved     PT SHORT TERM GOAL #3   Title Pt cervical and scapular strength to increase to allow pt to have no increase symptoms while carrying groceries to the car.    Time 2   Period Weeks   Status On-going           PT Long Term Goals - 08/06/17 1609      PT LONG TERM GOAL #1   Title Pt cervical pain to be no greater than a 2/10 to allow pt to sleep without having to use ice or medication at night.    Time 4   Period Weeks   Status On-going     PT LONG TERM GOAL #2   Title Pt cervical and scapular stabilization mm to be strong enough to allow pt to fix her hair  without any increase symptoms.   Time 4   Period Weeks   Status On-going     PT LONG TERM GOAL #3   Title Pt to verbalize and demonstrate the importance of posture and body mechanics in cervical care.    Time 4   Period Weeks   Status On-going     PT LONG TERM GOAL #4   Title Pt to be back at the gym working out on the elliptical with no increase symptoms    Status On-going               Plan - 08/06/17 1606    Clinical Impression Statement Pt in minimal pain today but had a significant headache to where she had to lie down yesterday.  Mild to moderate mm spasm /(small) in B trap which were absolved with manual.    Rehab Potential Good   PT Frequency 2x / week   PT Duration 4 weeks   PT Treatment/Interventions ADLs/Self Care Home Management;Ultrasound;Traction;Moist Heat;Patient/family education;Manual techniques   PT Next Visit Plan Continues with cervical stab adding lifting from waist to cupboard  ie putting dishes away .  Begin elliptical       Patient will benefit from skilled therapeutic intervention in order to improve the following deficits and impairments:  Decreased activity tolerance, Decreased range of motion, Decreased strength, Postural dysfunction, Pain  Visit Diagnosis: Cervicalgia  Abnormal posture     Problem List Patient Active Problem List   Diagnosis Date Noted  . IUD (intrauterine device) in place 04/08/2015  . Hashimoto's thyroiditis 07/19/2013  . Contraception management 07/19/2013  . Constipation 02/09/2012  . Hypothyroid 02/09/2012    Joanna Mcgee, PT CLT [phone] 08/06/2017, 4:10 PM  [facility] [address] [place], [place], [address] Phone: [phone]   Fax:  [phone]  Name: Joanna Mcgee MRN: [id number] Date of Birth: 03/31/1976

## 2017-08-09 ENCOUNTER — Telehealth (HOSPITAL_COMMUNITY): Payer: Self-pay | Admitting: Family Medicine

## 2017-08-09 NOTE — Telephone Encounter (Signed)
08/09/17  pt called and wanted to come in earlier or move to another day but nothing available... I told patient we could add to wait list and call her and she said that was fine... this is a busy week and she will just come 11/

## 2017-08-10 ENCOUNTER — Ambulatory Visit (HOSPITAL_COMMUNITY): Payer: 59

## 2017-08-12 ENCOUNTER — Ambulatory Visit (HOSPITAL_COMMUNITY): Payer: 59

## 2017-08-17 ENCOUNTER — Encounter (HOSPITAL_COMMUNITY): Payer: Self-pay

## 2017-08-17 ENCOUNTER — Ambulatory Visit (HOSPITAL_COMMUNITY): Payer: 59 | Attending: Family Medicine

## 2017-08-17 DIAGNOSIS — M542 Cervicalgia: Secondary | ICD-10-CM | POA: Diagnosis present

## 2017-08-17 DIAGNOSIS — R293 Abnormal posture: Secondary | ICD-10-CM

## 2017-08-17 NOTE — Patient Instructions (Addendum)
(  Home) Extension: Cranial - Bilateral UE Motion - Prone    With support under abdomen and hips, extend arms up and back at 45 angle to trunk, lifting trunk. Repeat 10-15 times per set. Do 1-2 sets per session.   Copyright  VHI. All rights reserved.   Scapular Depression: "Y" (Eccentric) - Prone (Ball)    Lie over ball or table. Quickly lift arms forward into "Y", thumbs up. Squeeze shoulder blades. Slowly lower for 3-5 seconds. Keep head in line with spine. 10-15 reps per set, 1-2_ sets per day. http://ecce.exer.us/223   Copyright  VHI. All rights reserved.   Row: Prone (Dumbbell)    Pull weights to sides, palms facing floor. Do 2 sets. Complete 10-15 repetitions.  Scapular Retraction: Flexion (Prone)    Lie with arms forward. Pinch shoulder blades together and raise arms a few inches from floor. Repeat 10-15 times per set. Do 1-2 sets per session. Do 2 sessions per day.  http://orth.exer.us/961   Copyright  VHI. All rights reserved.

## 2017-08-17 NOTE — Therapy (Signed)
[place], [place], [address] Phone: [phone]   Fax:  [phone]  Physical Therapy Treatment (Discharge)  Patient Details  Name: Joanna Mcgee MRN: [id number] Date of Birth: 1976/10/07 Referring Provider: Mariann Barter   Encounter Date: 08/17/2017   PHYSICAL THERAPY DISCHARGE SUMMARY  Visits from Start of Care: 7  Current functional level related to goals / functional outcomes: DC today due to high level of function, majority of goals met; patient satisfied with current status.    Remaining deficits: See below    Education / Equipment: See below  Plan: Patient agrees to discharge.  Patient goals were met. Patient is being discharged due to meeting the stated rehab goals.  ?????     Deniece Ree PT, DPT [phone]   PT End of Session - 08/17/17 1520    Visit Number  7    Number of Visits  8    Date for PT Re-Evaluation  08/15/17    Authorization Type  [employer]    Authorization - Visit Number  7    Authorization - Number of Visits  8    PT Start Time  7530    PT Stop Time  1555    PT Time Calculation (min)  38 min    Activity Tolerance  Patient tolerated treatment well;No increased pain    Behavior During Therapy  WFL for tasks assessed/performed       Past Medical History:  Diagnosis Date  . Anxiety   . Family history of anesthesia complication    Dad coded after surgery. Believed it was codeine.  Marland Kitchen GERD (gastroesophageal reflux disease)   . Hashimoto's disease   . Hypertension   . IUD (intrauterine device) in place 04/08/2015  . Seasonal allergies   . Thyroid disease     Past Surgical History:  Procedure Laterality Date  . WISDOM TOOTH EXTRACTION      There were no vitals filed for this visit.  Subjective Assessment - 08/17/17 1519    Subjective  Pt stated she is surprise with the weather (raining coldness outside) but she is pain free today.      Patient Stated Goals  To  no longer have the burning sensation, to be able to sleep without taking advil and using an ice pack.  To be able to reach overhead without discomfort and carry items without increased pain     Currently in Pain?  No/denies         [facility] PT Assessment - 08/17/17 0001      Assessment   Medical Diagnosis  cervical strain     Referring Provider  Michaeo Depasquale    Onset Date/Surgical Date  05/12/17    Prior Therapy  none      Cognition   Overall Cognitive Status  Within Functional Limits for tasks assessed      Observation/Other Assessments   Focus on Therapeutic Outcomes (FOTO)   -- was 57   was 57     AROM   AROM Assessment Site  Cervical    Cervical Flexion  -- was 52 reps no change of sx   was 52 reps no change of sx   Cervical Extension  -- was 35 reps increased sx   was 35 reps increased sx   Cervical - Right Side Bend  40 was 28   was 28   Cervical - Left Side Bend  40 was 31   was 31  Cervical - Right Rotation  65 was 40   was 40   Cervical - Left Rotation  65 was 60   was 60     Strength   Strength Assessment Site  Cervical    Cervical Extension  5/5 was 4+/5   was 4+/5   Cervical - Right Side Bend  5/5 was 3/5   was 3/5   Cervical - Left Side Bend  5/5 was 3/5   was 3/5                 [facility] Adult PT Treatment/Exercise - 08/17/17 0001      Neck Exercises: Machines for Strengthening   Other Machines for Strengthening  elliptical x 13mn with UE during subjective (not included with charges)      Neck Exercises: Standing   Other Standing Exercises  cervical retraction with shoulder flexion 20x I, 10x Bil lifting waist to cabinet      Neck Exercises: Prone   W Back  15 reps    Rows  15 reps    Rows Limitations  3#    Other Prone Exercise  prone W, T, I, Y 1x15 B     Other Prone Exercise  single arm raise x 10      Manual Therapy   Manual Therapy  Soft tissue mobilization;Manual Traction;Joint mobilization    Manual therapy comments   performed separately from all other skilled services     Soft tissue mobilization  prone to upper/mid traps and periscapular mm               PT Short Term Goals - 08/17/17 1520      PT SHORT TERM GOAL #1   Title  Pt  cervical pain to be no greater than a 4/10 to allow pt to sleep without taking OTC pain medication     Baseline  08/17/2017:  reports pain free and no medication in over a week, has taken Tylenol for headaches (a lot last week, none this week)    Time  2    Period  Weeks    Status  Achieved      PT SHORT TERM GOAL #2   Title  Pt cervical ROM to be WFL to allow pt to turn her head to easily see blind side while driving.     Baseline  08/17/2017:  Reports improved mechanics with driving no difficulty with blind spot to Rt     Status  Achieved      PT SHORT TERM GOAL #3   Title  Pt cervical and scapular strength to increase to allow pt to have no increase symptoms while carrying groceries to the car.     Baseline  08/17/17:   Reports carrying bag for work on opposite UE with no increased symptoms; no reports carrying groceries to the car.      Status  On-going        PT Long Term Goals - 08/17/17 1523      PT LONG TERM GOAL #1   Title  Pt cervical pain to be no greater than a 2/10 to allow pt to sleep without having to use ice or medication at night.     Baseline  08/17/17: pain no more than 2/10, able to sleep whole night wihtout ice or medication need.    Status  Achieved      PT LONG TERM GOAL #2   Title  Pt cervical and scapular stabilization mm to be  strong enough to allow pt to fix her hair without any increase symptoms.    Baseline  08/17/17:  No burning with UE flexion and able to fix her hair without any increased symptoms.    Time  4    Status  Achieved      PT LONG TERM GOAL #3   Title  Pt to verbalize and demonstrate the importance of posture and body mechanics in cervical care.     Baseline  08/17/17:  able to verbalize importance of posture  and demonstrate proper mechanic in cervical care    Status  Achieved      PT LONG TERM GOAL #4   Title  Pt to be back at the gym working out on the elliptical with no increase symptoms     Baseline  08/17/2017:  has not return to gym, able to complete 8 min at rehab with no reoprts of increased symptoms.    Status  Achieved            Plan - 08/17/17 1607    Clinical Impression Statement  Pt entered dept stated she has felt great all week, no reports of headaches, neck pain or radicular symptoms with functional activities.  Reviewed goals with all STG/LTGs met.  Pt presents with cervical mobility WNL, cervical strength 5/5, able to demonstrate and verbalize importance of posture.  Manual complete at EOS, pt does continue to have mild to moderate spasms in Bil upper and mid traps, encouraged to resume massage therapy to address tightness.  Pt reports ability to sleep through night without pain or waking.  Pt given advance HEP, able to verbalize and demonstrate appropriate form with HEP.      Rehab Potential  Good    PT Frequency  2x / week    PT Duration  4 weeks    PT Treatment/Interventions  ADLs/Self Care Home Management;Ultrasound;Traction;Moist Heat;Patient/family education;Manual techniques    PT Next Visit Plan  DC to HEP per all goals met.         Patient will benefit from skilled therapeutic intervention in order to improve the following deficits and impairments:  Decreased activity tolerance, Decreased range of motion, Decreased strength, Postural dysfunction, Pain  Visit Diagnosis: Cervicalgia  Abnormal posture     Problem List Patient Active Problem List   Diagnosis Date Noted  . IUD (intrauterine device) in place 04/08/2015  . Hashimoto's thyroiditis 07/19/2013  . Contraception management 07/19/2013  . Constipation 02/09/2012  . Hypothyroid 02/09/2012   Ihor Austin, [place]; [place]  Aldona Lento 08/17/2017, 4:20 PM  [facility] [address] [place], [place], [address] Phone: [phone]   Fax:  [phone]  Name: Joanna Mcgee MRN: [id number] Date of Birth: [birth year]

## 2017-08-20 ENCOUNTER — Encounter (HOSPITAL_COMMUNITY): Payer: 59 | Admitting: Physical Therapy

## 2017-08-24 ENCOUNTER — Encounter (HOSPITAL_COMMUNITY): Payer: 59

## 2017-08-27 ENCOUNTER — Encounter (HOSPITAL_COMMUNITY): Payer: 59 | Admitting: Physical Therapy

## 2017-09-24 DIAGNOSIS — E039 Hypothyroidism, unspecified: Secondary | ICD-10-CM | POA: Diagnosis not present

## 2017-09-28 DIAGNOSIS — M791 Myalgia, unspecified site: Secondary | ICD-10-CM | POA: Diagnosis not present

## 2017-09-28 DIAGNOSIS — E039 Hypothyroidism, unspecified: Secondary | ICD-10-CM | POA: Diagnosis not present

## 2017-10-29 DIAGNOSIS — G43119 Migraine with aura, intractable, without status migrainosus: Secondary | ICD-10-CM | POA: Diagnosis not present

## 2017-10-29 DIAGNOSIS — Z049 Encounter for examination and observation for unspecified reason: Secondary | ICD-10-CM | POA: Diagnosis not present

## 2017-10-29 DIAGNOSIS — G43719 Chronic migraine without aura, intractable, without status migrainosus: Secondary | ICD-10-CM | POA: Diagnosis not present

## 2017-11-02 DIAGNOSIS — R5383 Other fatigue: Secondary | ICD-10-CM | POA: Diagnosis not present

## 2017-11-02 DIAGNOSIS — M791 Myalgia, unspecified site: Secondary | ICD-10-CM | POA: Diagnosis not present

## 2017-11-02 DIAGNOSIS — R768 Other specified abnormal immunological findings in serum: Secondary | ICD-10-CM | POA: Diagnosis not present

## 2018-08-09 ENCOUNTER — Other Ambulatory Visit: Payer: Self-pay

## 2018-08-09 ENCOUNTER — Ambulatory Visit (INDEPENDENT_AMBULATORY_CARE_PROVIDER_SITE_OTHER): Payer: 59 | Admitting: Adult Health

## 2018-08-09 ENCOUNTER — Encounter: Payer: Self-pay | Admitting: Adult Health

## 2018-08-09 VITALS — BP 142/91 | HR 86 | Resp 16 | Ht 66.0 in | Wt 152.0 lb

## 2018-08-09 DIAGNOSIS — Z1212 Encounter for screening for malignant neoplasm of rectum: Secondary | ICD-10-CM

## 2018-08-09 DIAGNOSIS — Z01419 Encounter for gynecological examination (general) (routine) without abnormal findings: Secondary | ICD-10-CM

## 2018-08-09 DIAGNOSIS — Z975 Presence of (intrauterine) contraceptive device: Secondary | ICD-10-CM

## 2018-08-09 DIAGNOSIS — Z1211 Encounter for screening for malignant neoplasm of colon: Secondary | ICD-10-CM

## 2018-08-09 LAB — HEMOCCULT GUIAC POC 1CARD (OFFICE): Fecal Occult Blood, POC: NEGATIVE

## 2018-08-09 NOTE — Progress Notes (Signed)
Patient ID: Joanna Mcgee, female   DOB: 10-06-1976, 42 y.o.   MRN: [id number] History of Present Illness:  Joanna Mcgee is a 42 year old white female, married, in for well woman gyn exam, she had a normal pap with negative HPV 05/06/17. Has IUD was inserted 07/19/2013.  PCP is Dr Phillips Odor.   Current Medications, Allergies, Past Medical History, Past Surgical History, Family History and Social History were reviewed in [name] record.     Review of Systems: Patient denies any headaches, hearing loss, fatigue, blurred vision, shortness of breath, chest pain, abdominal pain, problems with bowel movements, urination, or intercourse. No joint pain or mood swings.    Physical Exam:BP (!) 142/91 (BP Location: Right Arm, Patient Position: Sitting, Cuff Size: Normal)   Pulse 86   Resp 16   Ht 5\' 6"  (1.676 m)   Wt 152 lb (68.9 kg)   BMI 24.53 kg/m  General:  Well developed, well nourished, no acute distress Skin:  Warm and dry Neck:  Midline trachea, normal thyroid, good ROM, no lymphadenopathy Lungs; Clear to auscultation bilaterally Breast:  No dominant palpable mass, retraction, or nipple discharge Cardiovascular: Regular rate and rhythm Abdomen:  Soft, non tender, no hepatosplenomegaly Pelvic:  External genitalia is normal in appearance, no lesions.  The vagina is normal in appearance. Urethra has no lesions or masses. The cervix is bulbous.+IUD strings.  Uterus is felt to be normal size, shape, and contour.  No adnexal masses or tenderness noted.Bladder is non tender, no masses felt. Rectal: Good sphincter tone, no polyps, or hemorrhoids felt.  Hemoccult negative. Extremities/musculoskeletal:  No swelling or varicosities noted, no clubbing or cyanosis Psych:  No mood changes, alert and cooperative,seems happy PHQ 2 score 0. Examination chaperoned by Francene Finders RN.  Impression: 1. Encounter for well woman exam with routine gynecological exam   2. Screening for  colorectal cancer   3. IUD (intrauterine device) in place       Plan: Check insurance on IUD Return in about a week for IUD removal and reinsertion with Drenda Freeze Physical in 1 year Pap in 2021 Get mammogram

## 2018-08-17 ENCOUNTER — Ambulatory Visit: Payer: 59 | Admitting: Advanced Practice Midwife

## 2018-08-18 ENCOUNTER — Other Ambulatory Visit: Payer: Self-pay

## 2018-08-18 ENCOUNTER — Encounter: Payer: Self-pay | Admitting: Advanced Practice Midwife

## 2018-08-18 ENCOUNTER — Ambulatory Visit (INDEPENDENT_AMBULATORY_CARE_PROVIDER_SITE_OTHER): Payer: 59 | Admitting: Advanced Practice Midwife

## 2018-08-18 VITALS — BP 135/76 | HR 84 | Ht 66.0 in | Wt 151.0 lb

## 2018-08-18 DIAGNOSIS — Z3202 Encounter for pregnancy test, result negative: Secondary | ICD-10-CM | POA: Diagnosis not present

## 2018-08-18 DIAGNOSIS — Z30433 Encounter for removal and reinsertion of intrauterine contraceptive device: Secondary | ICD-10-CM

## 2018-08-18 DIAGNOSIS — Z30430 Encounter for insertion of intrauterine contraceptive device: Secondary | ICD-10-CM | POA: Insufficient documentation

## 2018-08-18 DIAGNOSIS — Z30432 Encounter for removal of intrauterine contraceptive device: Secondary | ICD-10-CM

## 2018-08-18 LAB — POCT URINE PREGNANCY: Preg Test, Ur: NEGATIVE

## 2018-08-18 MED ORDER — LEVONORGESTREL 19.5 MCG/DAY IU IUD
INTRAUTERINE_SYSTEM | Freq: Once | INTRAUTERINE | Status: AC
  Administered 2018-08-18: 10:00:00 via INTRAUTERINE

## 2018-08-18 NOTE — Addendum Note (Signed)
Addended by: Tish Frederickson A on: 08/18/2018 10:11 AM   Modules accepted: Orders

## 2018-08-18 NOTE — Progress Notes (Signed)
Joanna Mcgee is a 42 y.o. year old Caucasian female   who presents for removal and replacement of her [name] IUD. The new IUD is [name] It has been 5 years since her previous IUD placement.  The risks and benefits of the method and placement have been thouroughly reviewed with the patient and all questions were answered.  Specifically the patient is aware of failure rate of 10/998, expulsion of the IUD and of possible perforation.  The patient is aware of irregular bleeding due to the method and understands the incidence of irregular bleeding diminishes with time.  Time out was performed.  A Graves speculum was placed.  The cervix was prepped using Betadine. The strings were found to be  visible.   They were grasped and the IUD was easily removed. The cervix was then grasped with a tenaculum and the uterus was sounded to 7 cm. The IUD was inserted to 7 cm.  It was pulled back 1 cm and the IUD was disengaged. After 15 seconds, the IUD was placed in the fundus.  The strings were trimmed to 3 cm.  Sonogram was performed and the proper placement of the IUD was verified.  The patient was instructed on signs and symptoms of infection and to check for the strings after each menses or each month.  The patient is to refrain from intercourse for 3 days.

## 2018-09-22 DIAGNOSIS — E039 Hypothyroidism, unspecified: Secondary | ICD-10-CM | POA: Diagnosis not present

## 2018-09-28 DIAGNOSIS — E039 Hypothyroidism, unspecified: Secondary | ICD-10-CM | POA: Diagnosis not present

## 2018-10-08 DIAGNOSIS — R05 Cough: Secondary | ICD-10-CM | POA: Diagnosis not present

## 2018-10-08 DIAGNOSIS — R0982 Postnasal drip: Secondary | ICD-10-CM | POA: Diagnosis not present

## 2018-10-08 DIAGNOSIS — J0110 Acute frontal sinusitis, unspecified: Secondary | ICD-10-CM | POA: Diagnosis not present

## 2018-11-17 ENCOUNTER — Other Ambulatory Visit (HOSPITAL_COMMUNITY): Payer: Self-pay | Admitting: Adult Health

## 2018-11-17 DIAGNOSIS — Z1231 Encounter for screening mammogram for malignant neoplasm of breast: Secondary | ICD-10-CM

## 2018-11-25 ENCOUNTER — Encounter (HOSPITAL_COMMUNITY): Payer: Self-pay

## 2018-11-25 ENCOUNTER — Ambulatory Visit (HOSPITAL_COMMUNITY)
Admission: RE | Admit: 2018-11-25 | Discharge: 2018-11-25 | Disposition: A | Discharge status: Home / Self Care | Payer: 59 | Source: Ambulatory Visit | Attending: Adult Health | Admitting: Adult Health

## 2018-11-25 DIAGNOSIS — Z1231 Encounter for screening mammogram for malignant neoplasm of breast: Secondary | ICD-10-CM | POA: Diagnosis not present

## 2018-11-28 ENCOUNTER — Telehealth: Payer: Self-pay | Admitting: Adult Health

## 2018-11-28 ENCOUNTER — Other Ambulatory Visit (HOSPITAL_COMMUNITY): Payer: Self-pay | Admitting: Adult Health

## 2018-11-28 DIAGNOSIS — R928 Other abnormal and inconclusive findings on diagnostic imaging of breast: Secondary | ICD-10-CM

## 2018-11-28 NOTE — Telephone Encounter (Signed)
Pt aware that mammogram shows asymmetry in both breasts has F/U diagnostic mammogram and [place] next week

## 2018-11-28 NOTE — Telephone Encounter (Signed)
Patient called stating that she would like Victorino Dike to give her a call back regarding results of her Mammo. Please contact pt

## 2018-12-06 ENCOUNTER — Ambulatory Visit (HOSPITAL_COMMUNITY)
Admission: RE | Admit: 2018-12-06 | Discharge: 2018-12-06 | Disposition: A | Discharge status: Home / Self Care | Payer: 59 | Source: Ambulatory Visit | Attending: Adult Health | Admitting: Adult Health

## 2018-12-06 DIAGNOSIS — R928 Other abnormal and inconclusive findings on diagnostic imaging of breast: Secondary | ICD-10-CM | POA: Insufficient documentation

## 2018-12-06 DIAGNOSIS — R922 Inconclusive mammogram: Secondary | ICD-10-CM | POA: Diagnosis not present

## 2018-12-06 DIAGNOSIS — N6002 Solitary cyst of left breast: Secondary | ICD-10-CM | POA: Diagnosis not present

## 2019-07-06 ENCOUNTER — Other Ambulatory Visit (HOSPITAL_COMMUNITY): Payer: Self-pay | Admitting: Adult Health

## 2019-07-06 DIAGNOSIS — Z09 Encounter for follow-up examination after completed treatment for conditions other than malignant neoplasm: Secondary | ICD-10-CM

## 2019-07-25 ENCOUNTER — Ambulatory Visit (HOSPITAL_COMMUNITY): Payer: 59

## 2019-07-25 ENCOUNTER — Encounter (HOSPITAL_COMMUNITY): Payer: 59

## 2019-08-15 ENCOUNTER — Inpatient Hospital Stay (HOSPITAL_COMMUNITY): Admission: RE | Admit: 2019-08-15 | Payer: 59 | Source: Ambulatory Visit

## 2019-08-15 ENCOUNTER — Ambulatory Visit (HOSPITAL_COMMUNITY): Payer: 59

## 2021-02-10 ENCOUNTER — Other Ambulatory Visit (HOSPITAL_COMMUNITY): Payer: Self-pay | Admitting: Adult Health

## 2021-02-10 ENCOUNTER — Other Ambulatory Visit (HOSPITAL_COMMUNITY)
Admission: RE | Admit: 2021-02-10 | Discharge: 2021-02-10 | Disposition: A | Discharge status: Home / Self Care | Payer: BC Managed Care – PPO | Source: Ambulatory Visit | Attending: Adult Health | Admitting: Adult Health

## 2021-02-10 ENCOUNTER — Ambulatory Visit (INDEPENDENT_AMBULATORY_CARE_PROVIDER_SITE_OTHER): Payer: BC Managed Care – PPO | Admitting: Adult Health

## 2021-02-10 ENCOUNTER — Other Ambulatory Visit: Payer: Self-pay

## 2021-02-10 ENCOUNTER — Encounter: Payer: Self-pay | Admitting: Adult Health

## 2021-02-10 ENCOUNTER — Other Ambulatory Visit: Payer: Self-pay | Admitting: Adult Health

## 2021-02-10 VITALS — BP 120/84 | HR 68 | Ht 66.0 in | Wt 147.0 lb

## 2021-02-10 DIAGNOSIS — R928 Other abnormal and inconclusive findings on diagnostic imaging of breast: Secondary | ICD-10-CM

## 2021-02-10 DIAGNOSIS — Z01419 Encounter for gynecological examination (general) (routine) without abnormal findings: Secondary | ICD-10-CM

## 2021-02-10 DIAGNOSIS — Z975 Presence of (intrauterine) contraceptive device: Secondary | ICD-10-CM | POA: Diagnosis not present

## 2021-02-10 DIAGNOSIS — Z1211 Encounter for screening for malignant neoplasm of colon: Secondary | ICD-10-CM

## 2021-02-10 LAB — HEMOCCULT GUIAC POC 1CARD (OFFICE): Fecal Occult Blood, POC: NEGATIVE

## 2021-02-10 NOTE — Progress Notes (Signed)
Patient ID: Joanna Mcgee, female   DOB: 05/20/1976, 45 y.o.   MRN: [id number] History of Present Illness: Joanna Mcgee is a 45 year old white female, married, G1P1, in for a well woman gyn exam and pap.  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 PCP is Joanna Mcgee.   Current Medications, Allergies, Past Medical History, Past Surgical History, Family History and Social History were reviewed in [name] record.     Review of Systems: Patient denies any headaches, hearing loss, fatigue, blurred vision, shortness of breath, chest pain, abdominal pain, problems with bowel movements, urination, or intercourse. No joint pain or mood swings. Has night sweats    Physical Exam:BP 120/84 (BP Location: Left Arm, Cuff Size: Normal)   Pulse 68   Ht 5\' 6"  (1.676 m)   Wt 147 lb (66.7 kg)   BMI 23.73 kg/m  General:  Well developed, well nourished, no acute distress Skin:  Warm and dry Neck:  Midline trachea, normal thyroid, good ROM, no lymphadenopathy Lungs; Clear to auscultation bilaterally Breast:  No dominant palpable mass, retraction, or nipple discharge Cardiovascular: Regular rate and rhythm Abdomen:  Soft, non tender, no hepatosplenomegaly Pelvic:  External genitalia is normal in appearance, no lesions.  The vagina is normal in appearance. Urethra has no lesions or masses. The cervix is bulbous,+IUD strings at os,pap with HR HPV genotyping performed .  Uterus is felt to be normal size, shape, and contour.  No adnexal masses or tenderness noted.Bladder is non tender, no masses felt. Rectal: Good sphincter tone, no polyps, or hemorrhoids felt.  Hemoccult negative. Extremities/musculoskeletal:  No swelling or varicosities noted, no clubbing or cyanosis Psych:  No mood changes, alert and cooperative,seems happy AA is 1 Fall risk is low PHQ 9 score is 0 GAD 7 score is 0  [facility] - 02/10/21 1523      Pregnancy Intention Screening   Does the patient want to become pregnant in the  next year? No    Does the patient's partner want to become pregnant in the next year? No    Would the patient like to discuss contraceptive options today? No      Contraception Wrap Up   Current Method IUD or IUS   IUD   End Method IUD or IUS   IUD   Contraception Counseling Provided No         Examination chaperoned by 04/12/21 LPN  Impression and plan: 1. Encounter for gynecological examination with Papanicolaou smear of cervix Pap sent Physical in 1 year Pap in 3 if normal Will check labs  - Cytology - PAP( [place]) - CBC - Comprehensive metabolic panel - Lipid panel She gets TSH checked  at endocrinologist   2. Abnormal mammogram of right breast Scheduled mammogram for her 5/24 at 3:20 pm at [facility]  - MM DIAG BREAST TOMO BILATERAL; Future - [facility] BREAST LTD UNI RIGHT INC AXILLA; Future  3. IUD (intrauterine device) in place Liletta placed 08/18/2018.  4. Encounter for screening fecal occult blood testing

## 2021-02-11 LAB — COMPREHENSIVE METABOLIC PANEL
ALT: 12 IU/L (ref 0–32)
AST: 16 IU/L (ref 0–40)
Albumin/Globulin Ratio: 2.1 (ref 1.2–2.2)
Albumin: 4.6 g/dL (ref 3.8–4.8)
Alkaline Phosphatase: 52 IU/L (ref 44–121)
BUN/Creatinine Ratio: 15 (ref 9–23)
BUN: 13 mg/dL (ref 6–24)
Bilirubin Total: 0.7 mg/dL (ref 0.0–1.2)
CO2: 26 mmol/L (ref 20–29)
Calcium: 9.7 mg/dL (ref 8.7–10.2)
Chloride: 100 mmol/L (ref 96–106)
Creatinine, Ser: 0.88 mg/dL (ref 0.57–1.00)
Globulin, Total: 2.2 g/dL (ref 1.5–4.5)
Glucose: 80 mg/dL (ref 65–99)
Potassium: 4.2 mmol/L (ref 3.5–5.2)
Sodium: 141 mmol/L (ref 134–144)
Total Protein: 6.8 g/dL (ref 6.0–8.5)
eGFR: 84 mL/min/{1.73_m2} (ref >59)

## 2021-02-11 LAB — LIPID PANEL
Chol/HDL Ratio: 2.9 ratio (ref 0.0–4.4)
Cholesterol, Total: 198 mg/dL (ref 100–199)
HDL: 68 mg/dL (ref >39)
LDL Chol Calc (NIH): 109 mg/dL — ABNORMAL HIGH (ref 0–99)
Triglycerides: 118 mg/dL (ref 0–149)
VLDL Cholesterol Cal: 21 mg/dL (ref 5–40)

## 2021-02-11 LAB — CBC
Hematocrit: 39.9 % (ref 34.0–46.6)
Hemoglobin: 13.4 g/dL (ref 11.1–15.9)
MCH: 30.7 pg (ref 26.6–33.0)
MCHC: 33.6 g/dL (ref 31.5–35.7)
MCV: 92 fL (ref 79–97)
Platelets: 179 10*3/uL (ref 150–450)
RBC: 4.36 x10E6/uL (ref 3.77–5.28)
RDW: 12.2 % (ref 11.7–15.4)
WBC: 6.1 10*3/uL (ref 3.4–10.8)

## 2021-02-12 LAB — CYTOLOGY - PAP
Comment: NEGATIVE
Diagnosis: NEGATIVE
High risk HPV: NEGATIVE

## 2021-03-04 ENCOUNTER — Other Ambulatory Visit: Payer: Self-pay

## 2021-03-04 ENCOUNTER — Ambulatory Visit (HOSPITAL_COMMUNITY)
Admission: RE | Admit: 2021-03-04 | Discharge: 2021-03-04 | Disposition: A | Discharge status: Home / Self Care | Payer: BC Managed Care – PPO | Source: Ambulatory Visit | Attending: Adult Health | Admitting: Adult Health

## 2021-03-04 DIAGNOSIS — R928 Other abnormal and inconclusive findings on diagnostic imaging of breast: Secondary | ICD-10-CM | POA: Insufficient documentation

## 2021-03-05 ENCOUNTER — Other Ambulatory Visit: Payer: Self-pay | Admitting: Adult Health

## 2021-03-05 MED ORDER — ESTRADIOL 0.05 MG/24HR TD PTTW: 1.0000 | MEDICATED_PATCH | TRANSDERMAL | 12 refills | Status: DC

## 2021-03-05 NOTE — Progress Notes (Signed)
Will try Vivelle dot for night sweats

## 2021-05-21 ENCOUNTER — Other Ambulatory Visit: Payer: Self-pay | Admitting: Adult Health

## 2021-05-21 MED ORDER — ESTRADIOL 0.05 MG/24HR TD PTTW: 1.0000 | MEDICATED_PATCH | TRANSDERMAL | 4 refills | Status: DC

## 2021-05-21 NOTE — Progress Notes (Signed)
Refilled vivelle dot to [facility]

## 2022-06-10 ENCOUNTER — Other Ambulatory Visit (HOSPITAL_COMMUNITY): Payer: Self-pay | Admitting: Physician Assistant

## 2022-06-10 DIAGNOSIS — Z1231 Encounter for screening mammogram for malignant neoplasm of breast: Secondary | ICD-10-CM

## 2022-06-22 ENCOUNTER — Ambulatory Visit (HOSPITAL_COMMUNITY)
Admission: RE | Admit: 2022-06-22 | Discharge: 2022-06-22 | Disposition: A | Discharge status: Home / Self Care | Payer: BLUE CROSS/BLUE SHIELD | Source: Ambulatory Visit | Attending: Physician Assistant | Admitting: Physician Assistant

## 2022-06-22 DIAGNOSIS — Z1231 Encounter for screening mammogram for malignant neoplasm of breast: Secondary | ICD-10-CM | POA: Diagnosis present

## 2022-06-25 ENCOUNTER — Other Ambulatory Visit (HOSPITAL_COMMUNITY): Payer: Self-pay | Admitting: Physician Assistant

## 2022-06-25 DIAGNOSIS — R928 Other abnormal and inconclusive findings on diagnostic imaging of breast: Secondary | ICD-10-CM

## 2022-07-14 ENCOUNTER — Ambulatory Visit (HOSPITAL_COMMUNITY)
Admission: RE | Admit: 2022-07-14 | Discharge: 2022-07-14 | Disposition: A | Discharge status: Home / Self Care | Payer: BLUE CROSS/BLUE SHIELD | Source: Ambulatory Visit | Attending: Physician Assistant | Admitting: Physician Assistant

## 2022-07-14 ENCOUNTER — Other Ambulatory Visit: Payer: Self-pay | Admitting: Adult Health

## 2022-07-14 DIAGNOSIS — R928 Other abnormal and inconclusive findings on diagnostic imaging of breast: Secondary | ICD-10-CM | POA: Insufficient documentation

## 2022-10-28 IMAGING — MG DIGITAL DIAGNOSTIC BILAT W/ TOMO W/ CAD
8 series · 8 of 24 positions shown · non-contrast
Comparison: Previous exams.

CLINICAL DATA: Follow-up for probably benign findings in the right
breast from 3838.

EXAM:
DIGITAL DIAGNOSTIC BILATERAL MAMMOGRAM WITH TOMOSYNTHESIS AND CAD;
ULTRASOUND LEFT BREAST LIMITED
TECHNIQUE: Bilateral digital diagnostic mammography and breast tomosynthesis
was performed. The images were evaluated with computer-aided
detection.; Targeted ultrasound examination of the left breast was
performed

[L CC synth-2D]
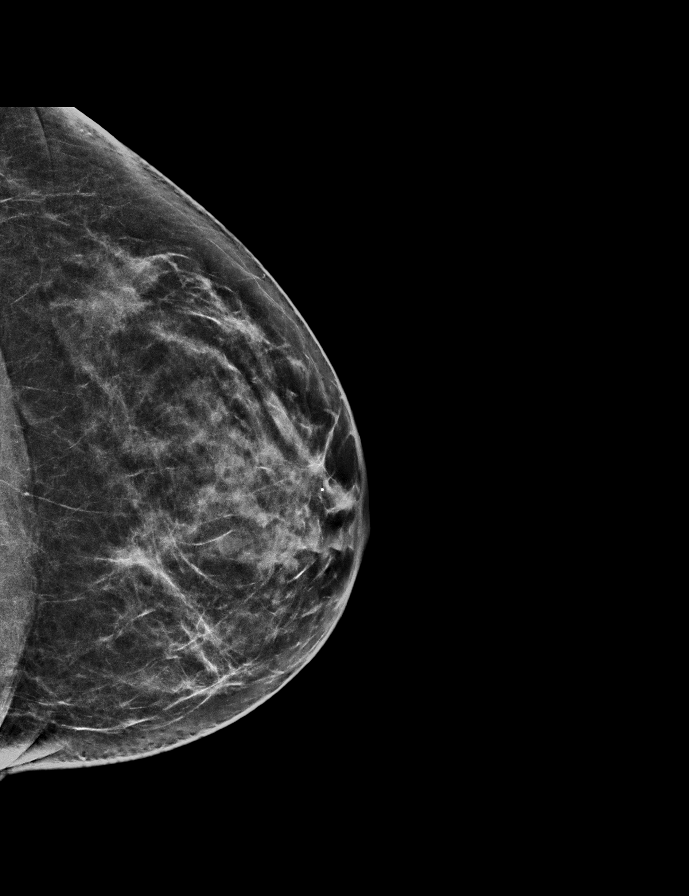

[R MLO synth-2D]
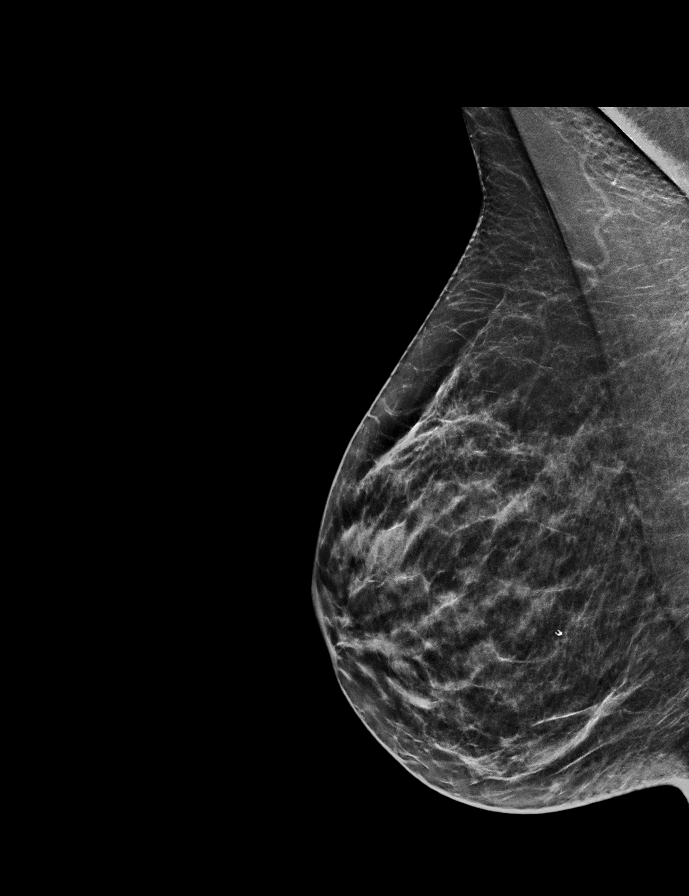

[R CC synth-2D]
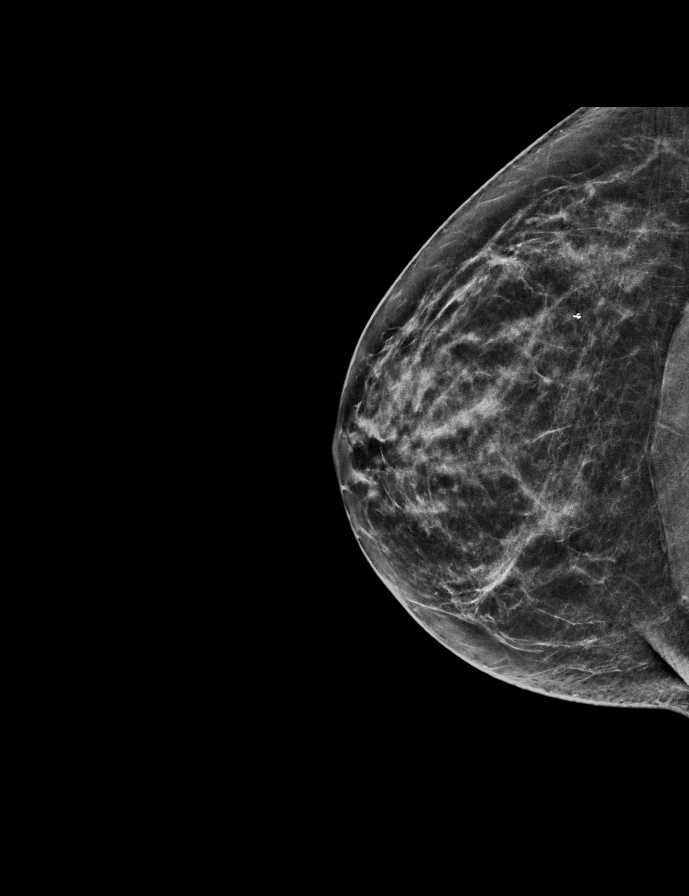

[L MLO synth-2D]
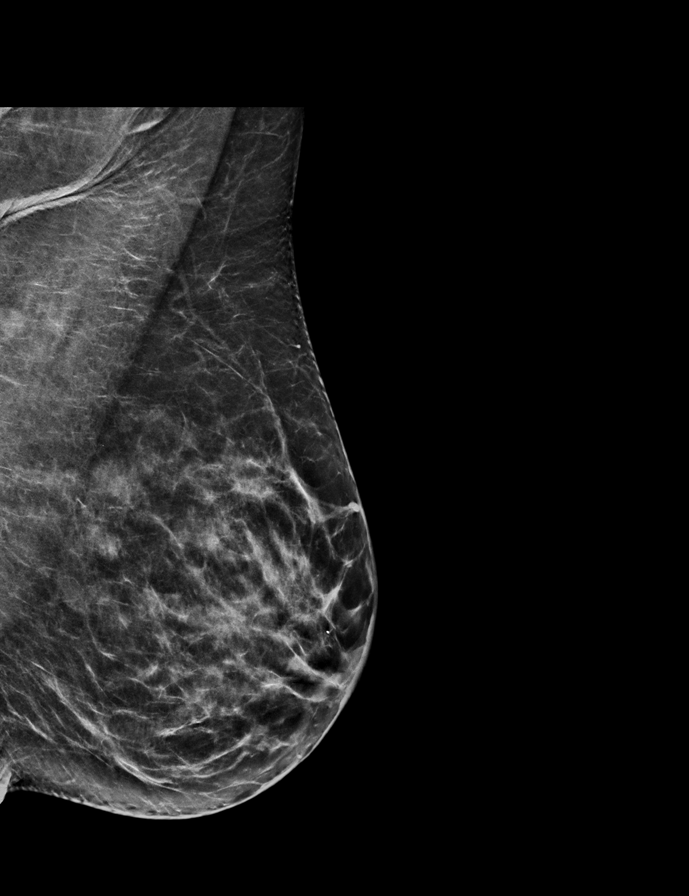

[L MLO tomo · tomo slice 31/62.0]
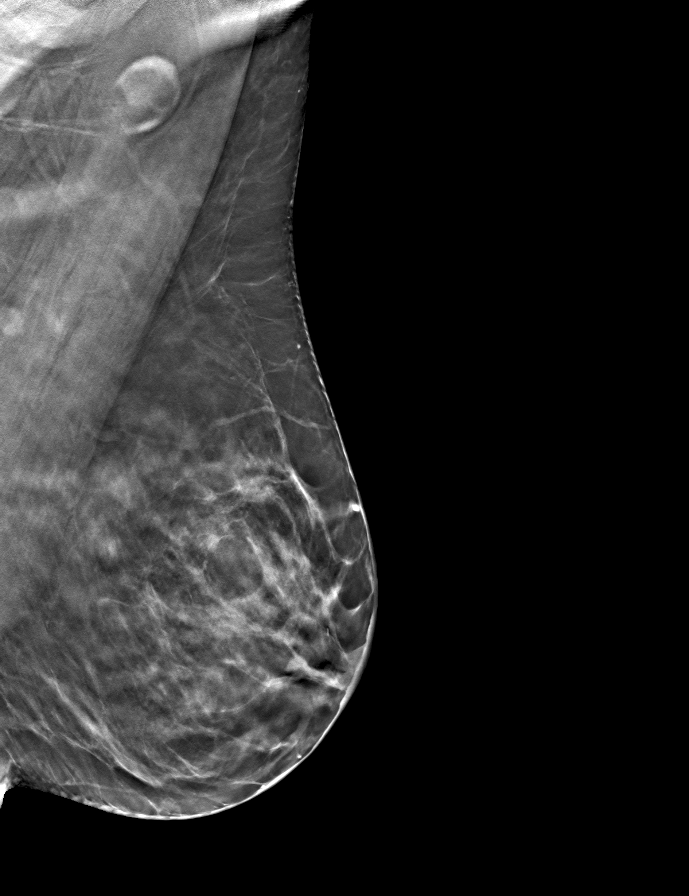

[R CC tomo · tomo slice 29/57.0]
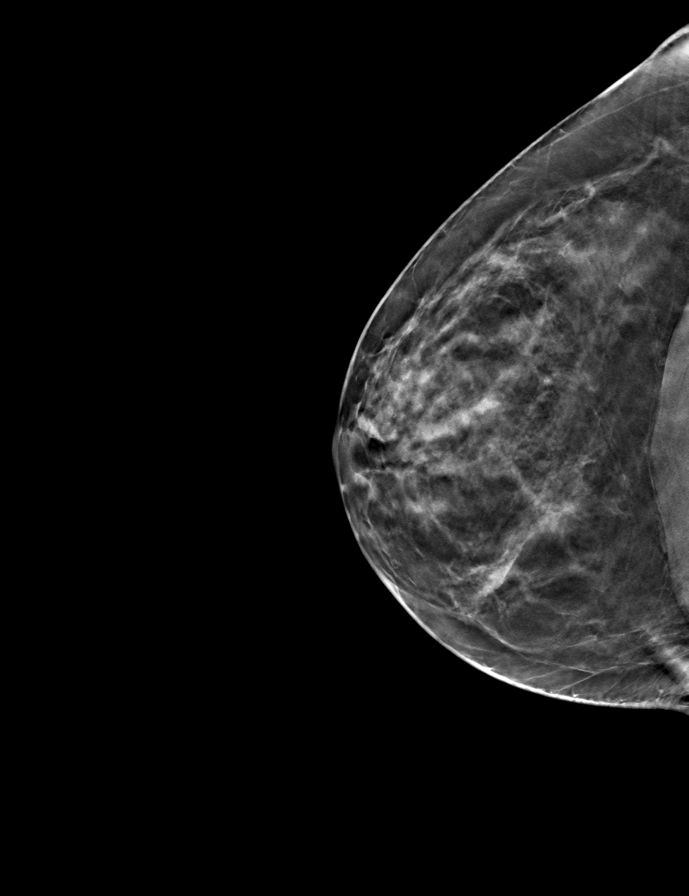

[L CC tomo · tomo slice 29/58.0]
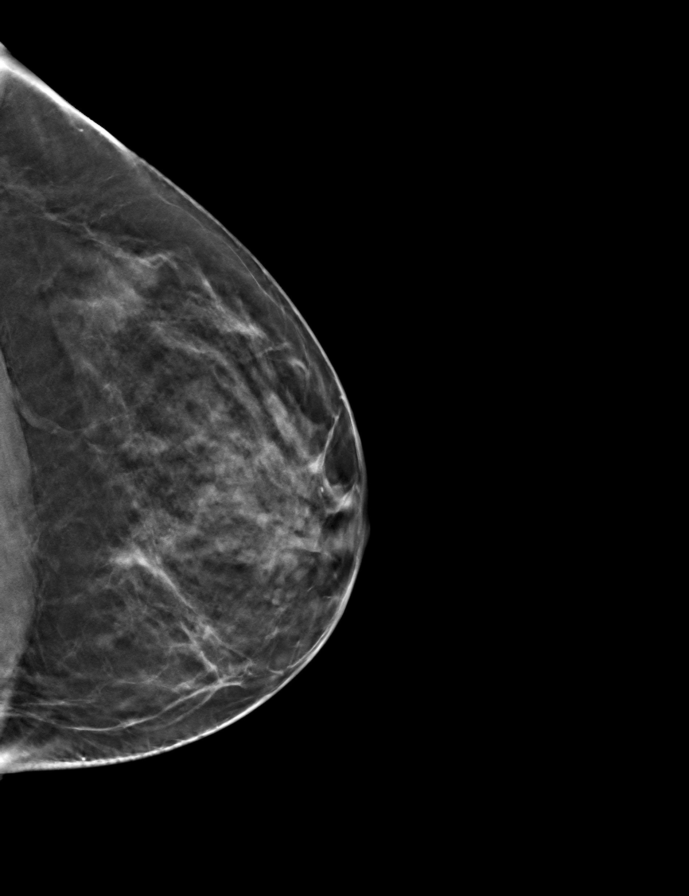

[R MLO tomo · tomo slice 29/57.0]
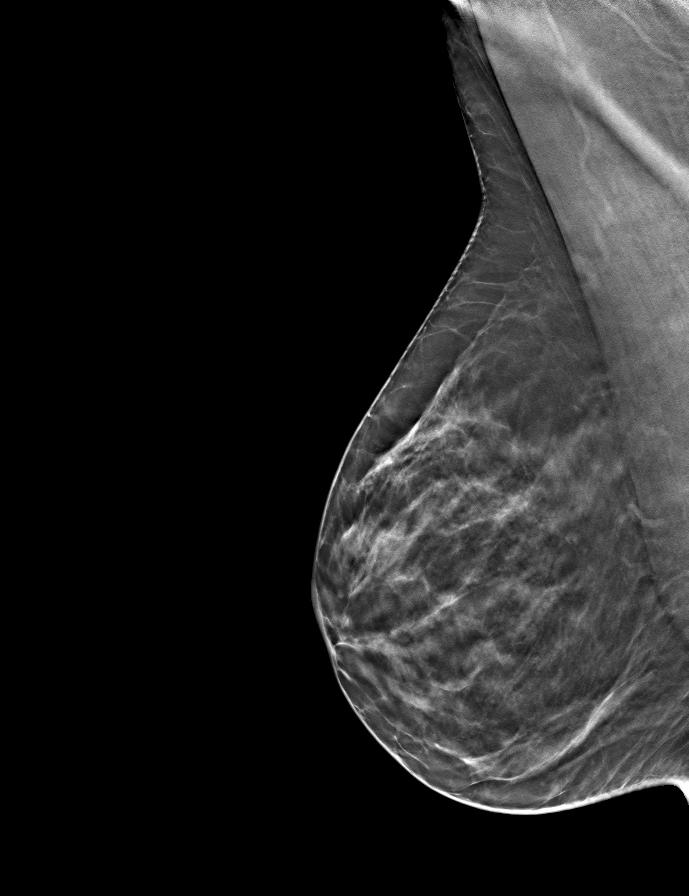

[8 of 24 positions shown; findings below may reference images not displayed]

ACR Breast Density Category c: The breast tissue is heterogeneously
dense, which may obscure small masses.
FINDINGS: No suspicious masses or calcifications are seen in either breast.
The asymmetry in the inner right breast is stable to less
conspicuous when compared to prior mammograms from November 2018.
There is an oval circumscribed mass in the far outer left breast
measuring 0.8 cm.

Targeted ultrasound of the outer left breast was performed. There is
a septated cyst in the left breast at 3 o'clock 5 cm from nipple
measuring 0.8 x 0.5 x 1 cm. This corresponds well with the mass seen
in the outer left breast at mammography.
IMPRESSION: No findings of malignancy in either breast.

RECOMMENDATION:
Screening mammogram in one year.(Code:5Y-P-YB1)

I have discussed the findings and recommendations with the patient.
If applicable, a reminder letter will be sent to the patient
regarding the next appointment.

BI-RADS CATEGORY  2: Benign.

## 2022-10-28 IMAGING — US US BREAST*L* LIMITED INC AXILLA
1 series · 9 of 9 positions shown · non-contrast
Comparison: Previous exams.

CLINICAL DATA: Follow-up for probably benign findings in the right
breast from 3838.

EXAM:
DIGITAL DIAGNOSTIC BILATERAL MAMMOGRAM WITH TOMOSYNTHESIS AND CAD;
ULTRASOUND LEFT BREAST LIMITED
TECHNIQUE: Bilateral digital diagnostic mammography and breast tomosynthesis
was performed. The images were evaluated with computer-aided
detection.; Targeted ultrasound examination of the left breast was
performed

[Series 1: us breast*left* limited inc axilla · 0.07mm/px · 9 of 9 slices shown]
[im 1/9]
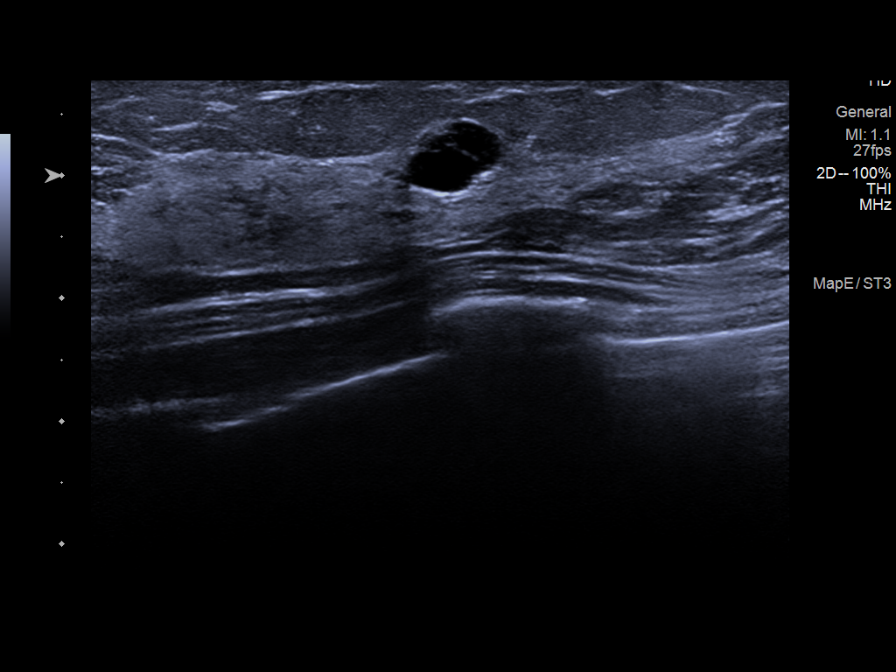
[im 2/9]
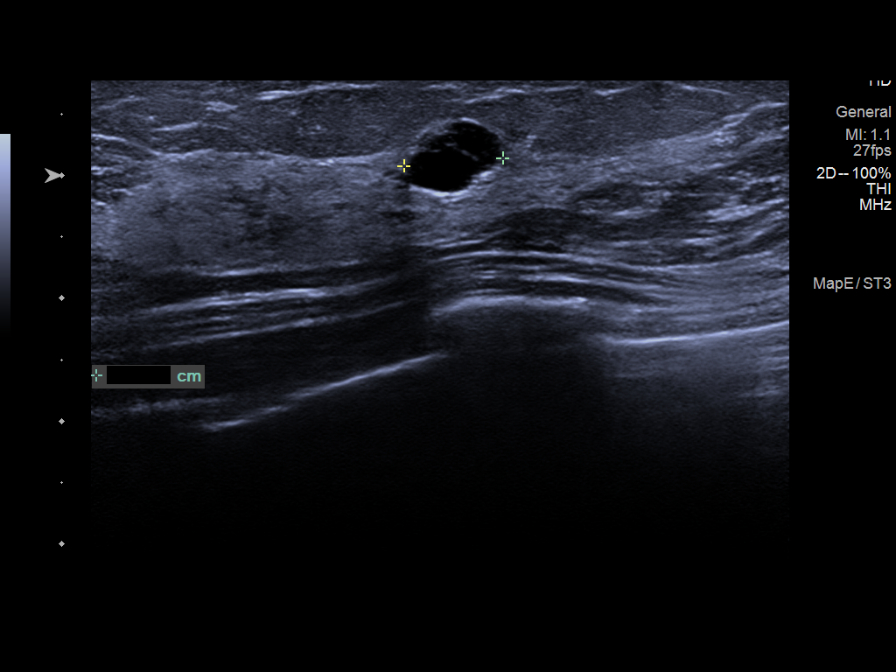
[im 3/9]
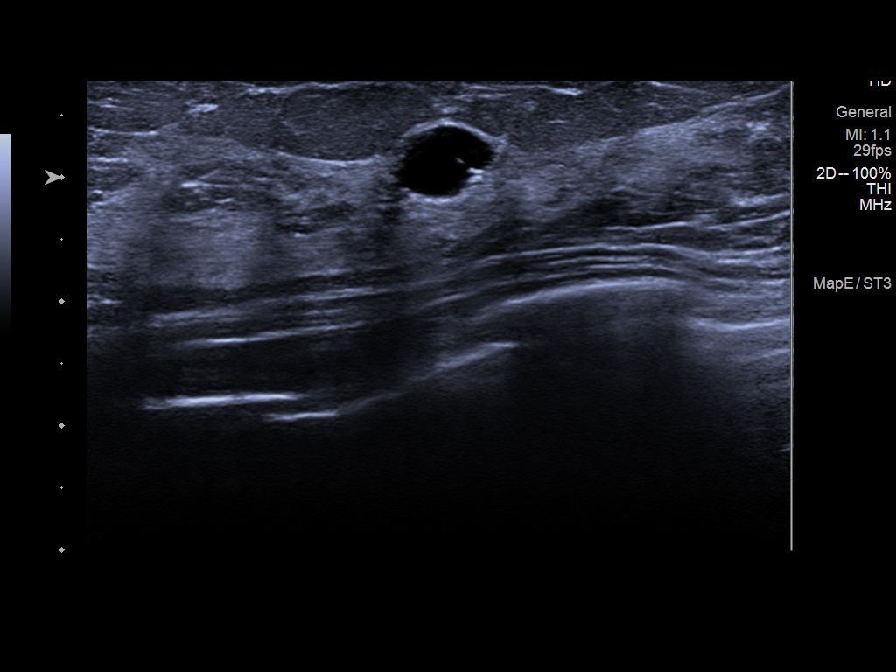
[im 4/9]
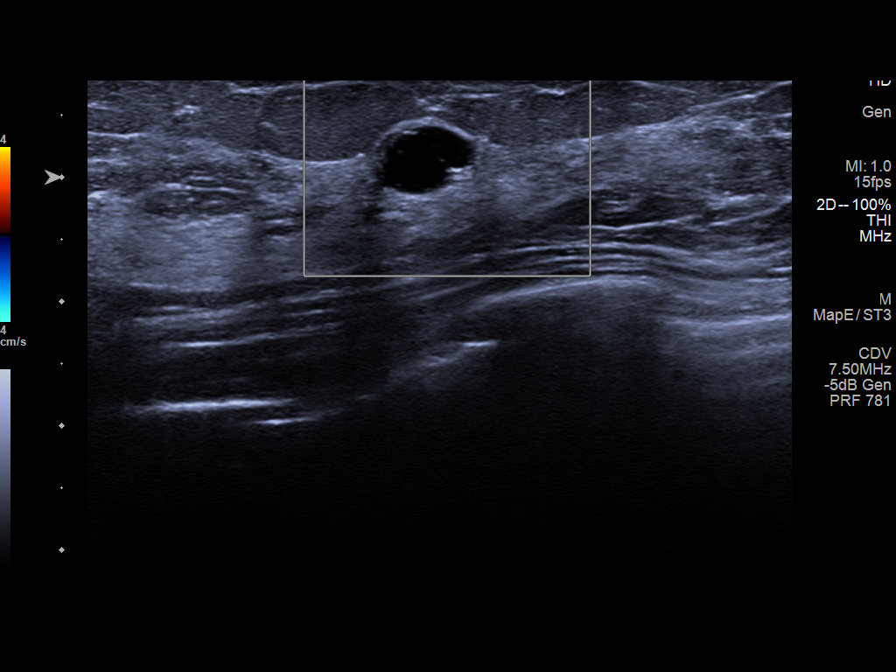
[im 5/9]
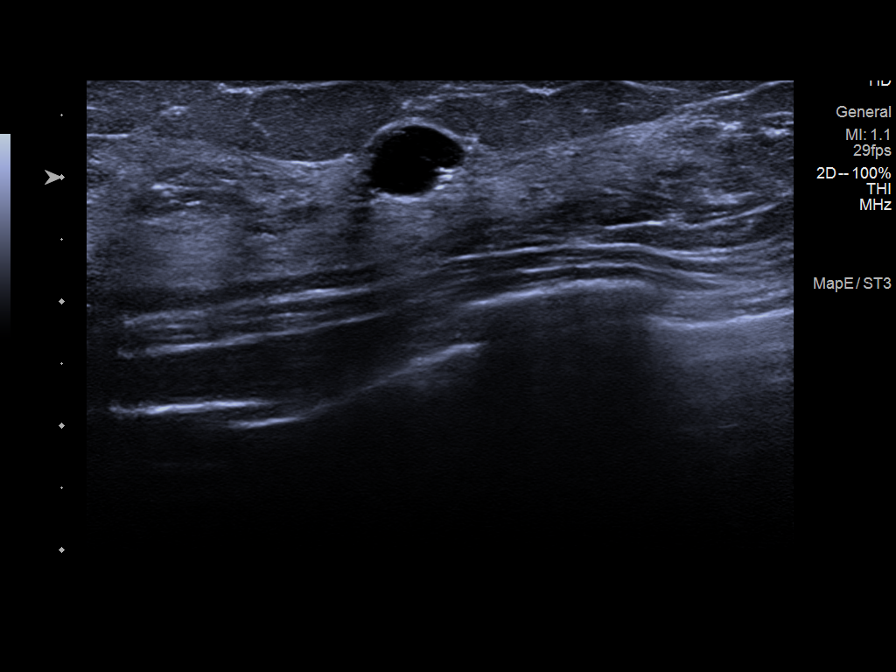
[im 6/9]
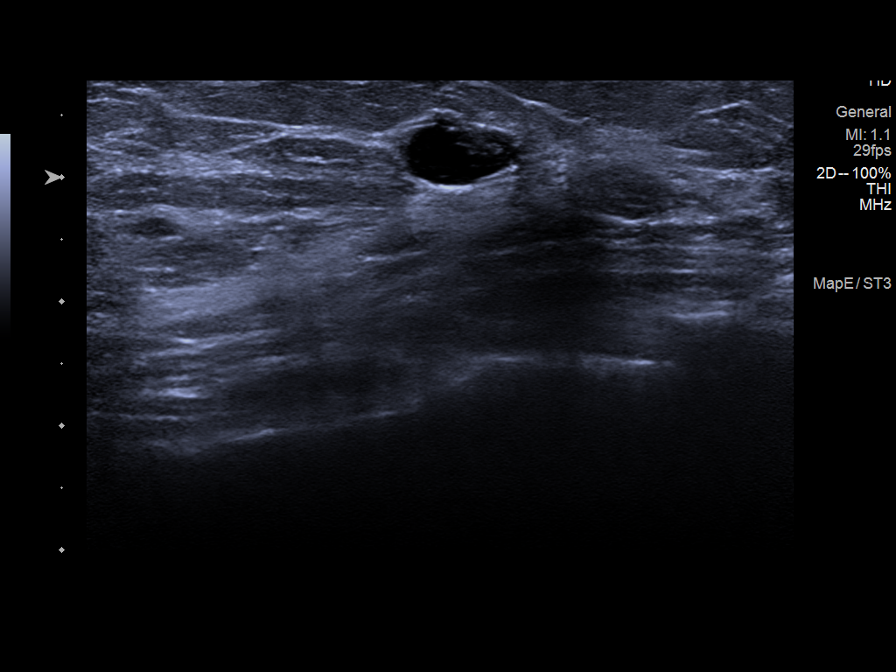
[im 7/9]
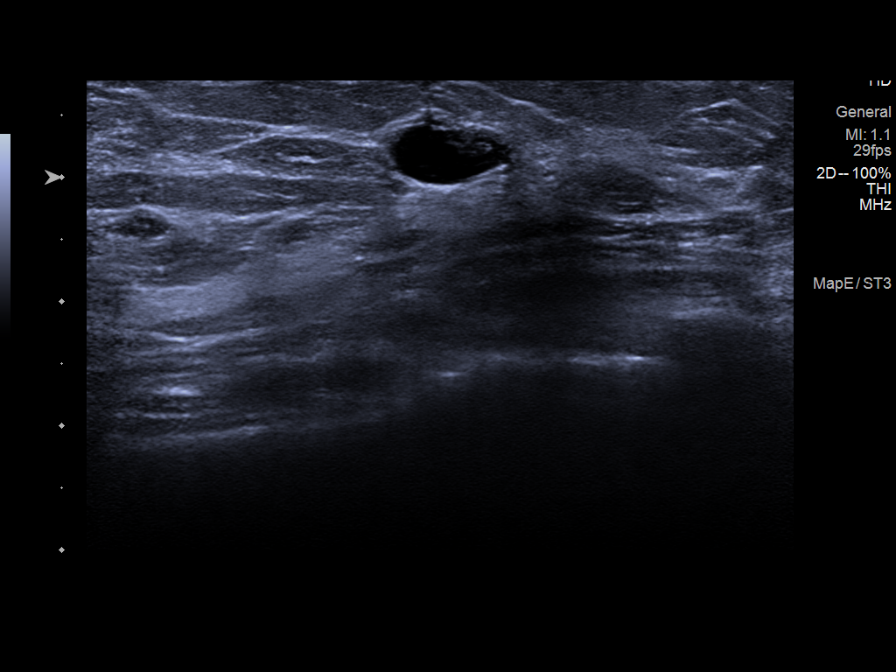
[im 8/9]
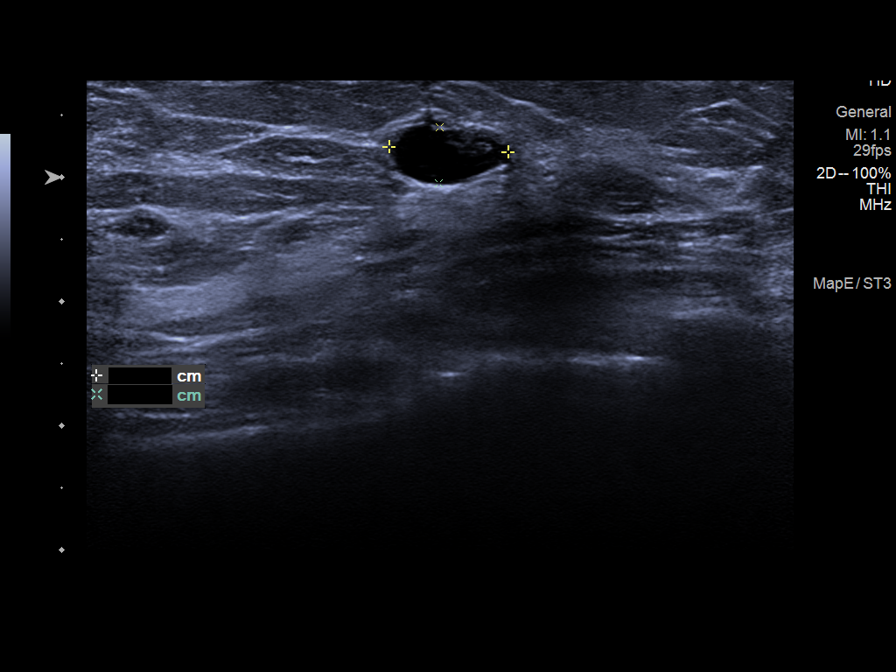
[im 9/9]
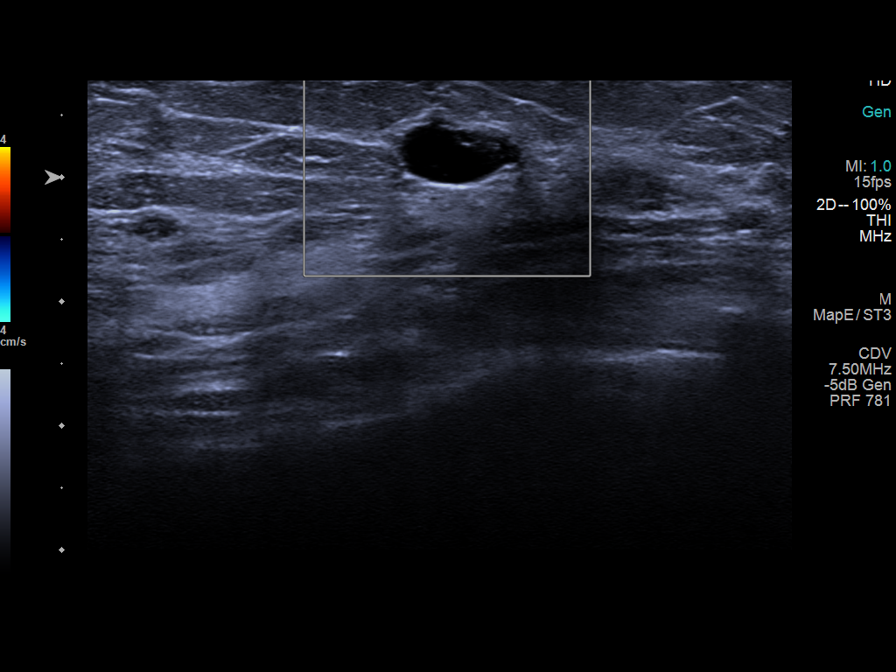

[9 of 9 positions shown; findings below may reference images not displayed]

ACR Breast Density Category c: The breast tissue is heterogeneously
dense, which may obscure small masses.
FINDINGS: No suspicious masses or calcifications are seen in either breast.
The asymmetry in the inner right breast is stable to less
conspicuous when compared to prior mammograms from November 2018.
There is an oval circumscribed mass in the far outer left breast
measuring 0.8 cm.

Targeted ultrasound of the outer left breast was performed. There is
a septated cyst in the left breast at 3 o'clock 5 cm from nipple
measuring 0.8 x 0.5 x 1 cm. This corresponds well with the mass seen
in the outer left breast at mammography.
IMPRESSION: No findings of malignancy in either breast.

RECOMMENDATION:
Screening mammogram in one year.(Code:5Y-P-YB1)

I have discussed the findings and recommendations with the patient.
If applicable, a reminder letter will be sent to the patient
regarding the next appointment.

BI-RADS CATEGORY  2: Benign.

## 2023-11-28 ENCOUNTER — Other Ambulatory Visit: Payer: Self-pay | Admitting: Adult Health

## 2024-01-31 ENCOUNTER — Other Ambulatory Visit: Payer: Self-pay | Admitting: Adult Health

## 2024-01-31 MED ORDER — ESTRADIOL 0.05 MG/24HR TD PTTW: 1.0000 | MEDICATED_PATCH | TRANSDERMAL | 0 refills | Status: DC

## 2024-01-31 NOTE — Progress Notes (Signed)
Refilled vivelle-dot

## 2024-02-29 ENCOUNTER — Other Ambulatory Visit (HOSPITAL_COMMUNITY)
Admission: RE | Admit: 2024-02-29 | Discharge: 2024-02-29 | Disposition: A | Discharge status: Home / Self Care | Source: Ambulatory Visit | Attending: Obstetrics & Gynecology | Admitting: Obstetrics & Gynecology

## 2024-02-29 ENCOUNTER — Ambulatory Visit: Admitting: Obstetrics & Gynecology

## 2024-02-29 ENCOUNTER — Encounter: Payer: Self-pay | Admitting: Obstetrics & Gynecology

## 2024-02-29 VITALS — BP 139/87 | HR 78 | Ht 66.0 in | Wt 161.0 lb

## 2024-02-29 DIAGNOSIS — Z01419 Encounter for gynecological examination (general) (routine) without abnormal findings: Secondary | ICD-10-CM | POA: Insufficient documentation

## 2024-02-29 DIAGNOSIS — R635 Abnormal weight gain: Secondary | ICD-10-CM | POA: Diagnosis not present

## 2024-02-29 DIAGNOSIS — N951 Menopausal and female climacteric states: Secondary | ICD-10-CM | POA: Diagnosis not present

## 2024-02-29 MED ORDER — ESTRADIOL 0.1 MG/24HR TD PTTW: 1.0000 | MEDICATED_PATCH | TRANSDERMAL | 12 refills | Status: DC

## 2024-02-29 NOTE — Progress Notes (Signed)
 Subjective:     Joanna Mcgee is a 48 y.o. female here for a routine exam.  No LMP recorded. (Menstrual status: IUD). G1P1001 Birth Control Method: Liletta  IUD, placed 2019 Menstrual Calendar(currently): Amenorrhea Current complaints: Vasomotor symptoms, joint pain.   Current acute medical issues: Hashimoto's thyroiditis currently requiring replacement   Recent Gynecologic History No LMP recorded. (Menstrual status: IUD). Last Pap: 2022, normal Last mammogram: 2023, normal  Past Medical History:  Diagnosis Date   Anxiety    Family history of anesthesia complication    Dad coded after surgery. Believed it was codeine.   GERD (gastroesophageal reflux disease)    Hashimoto's disease    Hypertension    IUD (intrauterine device) in place 04/08/2015   Seasonal allergies    Thyroid  disease     Past Surgical History:  Procedure Laterality Date   FLEXIBLE SIGMOIDOSCOPY N/A 03/21/2014   Procedure: FLEXIBLE SIGMOIDOSCOPY;  Surgeon: Ruby Corporal, MD;  Location: [facility];  Service: Endoscopy;  Laterality: N/A;  100   WISDOM TOOTH EXTRACTION      OB History     Gravida  1   Para  1   Term  1   Preterm      AB      Living  1      SAB      IAB      Ectopic      Multiple      Live Births  1           Social History   Socioeconomic History   Marital status: Married    Spouse name: Not on file   Number of children: Not on file   Years of education: Not on file   Highest education level: Not on file  Occupational History   Occupation: home health at [employer]   Tobacco Use   Smoking status: Never   Smokeless tobacco: Never  Substance and Sexual Activity   Alcohol use: Yes    Comment: occasional   Drug use: No    Frequency: 3.0 times per week   Sexual activity: Yes    Birth control/protection: I.U.D.  Other Topics Concern   Not on file  Social History Narrative   Not on file   Social Drivers of Health   Financial Resource Strain: Low Risk   (02/29/2024)   Overall Financial Resource Strain (CARDIA)    Difficulty of Paying Living Expenses: Not hard at all  Food Insecurity: No Food Insecurity (02/29/2024)   Hunger Vital Sign    Worried About Running Out of Food in the Last Year: Never true    Ran Out of Food in the Last Year: Never true  Transportation Needs: No Transportation Needs (02/29/2024)   PRAPARE - Administrator, Civil Service (Medical): No    Lack of Transportation (Non-Medical): No  Physical Activity: Sufficiently Active (02/29/2024)   Exercise Vital Sign    Days of Exercise per Week: 4 days    Minutes of Exercise per Session: 60 min  Stress: Stress Concern Present (02/29/2024)   [employer] of Occupational Health - Occupational Stress Questionnaire    Feeling of Stress : To some extent  Social Connections: Socially Integrated (02/29/2024)   Social Connection and Isolation Panel [NHANES]    Frequency of Communication with Friends and Family: More than three times a week    Frequency of Social Gatherings with Friends and Family: Once a week    Attends Religious Services: More  than 4 times per year    Active Member of Clubs or Organizations: Yes    Attends Engineer, structural: More than 4 times per year    Marital Status: Married    Family History  Problem Relation Age of Onset   Hypertension Mother    Heart disease Mother        CAD   Heart disease Father        CAD   Hypertension Father    Hypertension Paternal Grandmother    Cancer Maternal Grandmother        brain tumor   Hypertension Maternal Grandmother      Current Outpatient Medications:    buPROPion (WELLBUTRIN XL) 150 MG 24 hr tablet, Take 150 mg by mouth daily., Disp: , Rfl:    [START ON 03/02/2024] estradiol  (VIVELLE -DOT) 0.1 MG/24HR patch, Place 1 patch (0.1 mg total) onto the skin 2 (two) times a week., Disp: 8 patch, Rfl: 12   levonorgestrel  (LILETTA , 52 MG,) 20.1 MCG/DAY IUD, 1 each by Intrauterine route once.,  Disp: , Rfl:    levothyroxine (SYNTHROID, LEVOTHROID) 75 MCG tablet, Take 75 mcg by mouth daily before breakfast., Disp: , Rfl:    liothyronine (CYTOMEL) 5 MCG tablet, Take 5 mcg by mouth daily., Disp: , Rfl:    magnesium 30 MG tablet, Take 30 mg by mouth 2 (two) times daily., Disp: , Rfl:    Multiple Vitamins-Minerals (MULTI COMPLETE PO), Take 1 tablet by mouth daily. , Disp: , Rfl:    ibuprofen  (ADVIL ,MOTRIN ) 200 MG tablet, Take 200 mg by mouth every 6 (six) hours as needed for headache or moderate pain. (Patient not taking: Reported on 02/10/2021), Disp: , Rfl:   Review of Systems  Review of Systems  Constitutional: Negative for fever, chills, weight loss, malaise/fatigue and diaphoresis.  HENT: Negative for hearing loss, ear pain, nosebleeds, congestion, sore throat, neck pain, tinnitus and ear discharge.   Eyes: Negative for blurred vision, double vision, photophobia, pain, discharge and redness.  Respiratory: Negative for cough, hemoptysis, sputum production, shortness of breath, wheezing and stridor.   Cardiovascular: Negative for chest pain, palpitations, orthopnea, claudication, leg swelling and PND.  Gastrointestinal: negative for abdominal pain. Negative for heartburn, nausea, vomiting, diarrhea, constipation, blood in stool and melena.  Genitourinary: Negative for dysuria, urgency, frequency, hematuria and flank pain.  Musculoskeletal: Negative for myalgias, back pain, joint pain and falls.  Skin: Negative for itching and rash.  Neurological: Negative for dizziness, tingling, tremors, sensory change, speech change, focal weakness, seizures, loss of consciousness, weakness and headaches.  Endo/Heme/Allergies: Negative for environmental allergies and polydipsia. Does not bruise/bleed easily.  Psychiatric/Behavioral: Negative for depression, suicidal ideas, hallucinations, memory loss and substance abuse. The patient is not nervous/anxious and does not have insomnia.        Objective:   Blood pressure 139/87, pulse 78, height 5\' 6"  (1.676 m), weight 161 lb (73 kg).   Physical Exam  Vitals reviewed. Constitutional: She is oriented to person, place, and time. She appears well-developed and well-nourished.  HENT:  Head: Normocephalic and atraumatic.        Right Ear: External ear normal.  Left Ear: External ear normal.  Nose: Nose normal.  Mouth/Throat: Oropharynx is clear and moist.  Eyes: Conjunctivae and EOM are normal. Pupils are equal, round, and reactive to light. Right eye exhibits no discharge. Left eye exhibits no discharge. No scleral icterus.  Neck: Normal range of motion. Neck supple. No tracheal deviation present. No thyromegaly present.  Cardiovascular:  Normal rate, regular rhythm, normal heart sounds and intact distal pulses.  Exam reveals no gallop and no friction rub.   No murmur heard. Respiratory: Effort normal and breath sounds normal. No respiratory distress. She has no wheezes. She has no rales. She exhibits no tenderness.  GI: Soft. Bowel sounds are normal. She exhibits no distension and no mass. There is no tenderness. There is no rebound and no guarding.  Genitourinary:  Breasts no masses skin changes or nipple changes bilaterally      Vulva is normal without lesions Vagina is pink moist without discharge Cervix normal in appearance and pap is done, IUD strings are visible Uterus is normal size shape and contour Adnexa is negative with normal sized ovaries  Vasomotor symptoms related to menopause Musculoskeletal: Normal range of motion. She exhibits no edema and no tenderness.  Neurological: She is alert and oriented to person, place, and time. She has normal reflexes. She displays normal reflexes. No cranial nerve deficit. She exhibits normal muscle tone. Coordination normal.  Skin: Skin is warm and dry. No rash noted. No erythema. No pallor.  Psychiatric: She has a normal mood and affect. Her behavior is normal. Judgment and thought content  normal.       Medications Ordered at today's visit: Meds ordered this encounter  Medications   estradiol  (VIVELLE -DOT) 0.1 MG/24HR patch    Sig: Place 1 patch (0.1 mg total) onto the skin 2 (two) times a week.    Dispense:  8 patch    Refill:  12    Other orders placed at today's visit: No orders of the defined types were placed in this encounter.    ASSESSMENT + PLAN:    ICD-10-CM   1. Well woman exam with routine gynecological exam  Z01.419     2. Encounter for gynecological examination with Papanicolaou smear of cervix  Z01.419 Cytology - PAP( [facility])    3. Vasomotor symptoms due to menopause, presumed  N95.1    On Vivelle -Dot 0.05 mg, will increase to 0.1 mg and see her response, MyChart follow-up visit 3 months    4. Weight gain: Recommended resistance training using body by science method, eccentric failure  R63.5    Thyroid  levels are good on current dosing, discussed age and menopause related sarcopenia and weight gain as a result          Return in about 3 months (around 05/31/2024) for MyChart Connect visit to discuss vasomotor symptoms.

## 2024-03-07 LAB — CYTOLOGY - PAP
Comment: NEGATIVE
Diagnosis: NEGATIVE
High risk HPV: NEGATIVE

## 2024-08-16 ENCOUNTER — Other Ambulatory Visit: Payer: Self-pay | Admitting: Obstetrics & Gynecology

## 2024-08-21 ENCOUNTER — Ambulatory Visit (HOSPITAL_COMMUNITY)
Admission: RE | Admit: 2024-08-21 | Discharge: 2024-08-21 | Disposition: A | Discharge status: Home / Self Care | Source: Ambulatory Visit | Attending: Obstetrics & Gynecology | Admitting: Obstetrics & Gynecology

## 2024-08-21 ENCOUNTER — Encounter (HOSPITAL_COMMUNITY): Payer: Self-pay

## 2024-08-21 ENCOUNTER — Other Ambulatory Visit (HOSPITAL_COMMUNITY): Payer: Self-pay | Admitting: Obstetrics & Gynecology

## 2024-08-21 DIAGNOSIS — Z1231 Encounter for screening mammogram for malignant neoplasm of breast: Secondary | ICD-10-CM | POA: Diagnosis present

## 2024-08-23 ENCOUNTER — Ambulatory Visit (HOSPITAL_COMMUNITY): Payer: Self-pay | Admitting: Obstetrics & Gynecology
